# Patient Record
Sex: Female | Born: 1955 | Hispanic: Refuse to answer | Smoking: Former smoker
Health system: Southern US, Community
[De-identification: ages and names within clinical notes are randomized; demographics above are authoritative.]

## PROBLEM LIST (undated history)

## (undated) DIAGNOSIS — E28319 Asymptomatic premature menopause: Secondary | ICD-10-CM

## (undated) DIAGNOSIS — M810 Age-related osteoporosis without current pathological fracture: Secondary | ICD-10-CM

## (undated) DIAGNOSIS — E039 Hypothyroidism, unspecified: Secondary | ICD-10-CM

## (undated) DIAGNOSIS — I341 Nonrheumatic mitral (valve) prolapse: Secondary | ICD-10-CM

## (undated) HISTORY — DX: Age-related osteoporosis without current pathological fracture: M81.0

## (undated) HISTORY — DX: Nonrheumatic mitral (valve) prolapse: I34.1

## (undated) HISTORY — PX: DILATION AND CURETTAGE OF UTERUS: SHX78

## (undated) HISTORY — PX: OTHER SURGICAL HISTORY: SHX169

## (undated) HISTORY — DX: Asymptomatic premature menopause: E28.319

## (undated) HISTORY — DX: Hypothyroidism, unspecified: E03.9

---

## 1982-06-09 HISTORY — PX: CERVICAL CONE BIOPSY: SUR198

## 1998-11-01 ENCOUNTER — Other Ambulatory Visit: Admission: RE | Admit: 1998-11-01 | Discharge: 1998-11-01 | Payer: Self-pay | Admitting: Obstetrics and Gynecology

## 1999-10-17 ENCOUNTER — Other Ambulatory Visit: Admission: RE | Admit: 1999-10-17 | Discharge: 1999-10-17 | Payer: Self-pay | Admitting: Obstetrics and Gynecology

## 2000-11-05 ENCOUNTER — Other Ambulatory Visit: Admission: RE | Admit: 2000-11-05 | Discharge: 2000-11-05 | Payer: Self-pay | Admitting: Obstetrics and Gynecology

## 2001-11-05 ENCOUNTER — Other Ambulatory Visit: Admission: RE | Admit: 2001-11-05 | Discharge: 2001-11-05 | Payer: Self-pay | Admitting: Obstetrics and Gynecology

## 2003-01-25 ENCOUNTER — Other Ambulatory Visit: Admission: RE | Admit: 2003-01-25 | Discharge: 2003-01-25 | Payer: Self-pay | Admitting: Obstetrics and Gynecology

## 2004-01-24 ENCOUNTER — Other Ambulatory Visit: Admission: RE | Admit: 2004-01-24 | Discharge: 2004-01-24 | Payer: Self-pay | Admitting: Obstetrics and Gynecology

## 2005-02-05 ENCOUNTER — Other Ambulatory Visit: Admission: RE | Admit: 2005-02-05 | Discharge: 2005-02-05 | Payer: Self-pay | Admitting: Addiction Medicine

## 2006-02-11 ENCOUNTER — Other Ambulatory Visit: Admission: RE | Admit: 2006-02-11 | Discharge: 2006-02-11 | Payer: Self-pay | Admitting: Obstetrics and Gynecology

## 2006-04-09 ENCOUNTER — Ambulatory Visit (HOSPITAL_COMMUNITY): Admission: RE | Admit: 2006-04-09 | Discharge: 2006-04-09 | Payer: Self-pay | Admitting: Family Medicine

## 2006-05-28 ENCOUNTER — Ambulatory Visit (HOSPITAL_COMMUNITY): Admission: RE | Admit: 2006-05-28 | Discharge: 2006-05-28 | Payer: Self-pay | Admitting: Interventional Cardiology

## 2006-06-04 ENCOUNTER — Ambulatory Visit (HOSPITAL_COMMUNITY): Admission: RE | Admit: 2006-06-04 | Discharge: 2006-06-04 | Payer: Self-pay | Admitting: Gastroenterology

## 2006-08-13 ENCOUNTER — Ambulatory Visit (HOSPITAL_COMMUNITY): Admission: RE | Admit: 2006-08-13 | Discharge: 2006-08-13 | Payer: Self-pay | Admitting: Interventional Cardiology

## 2006-08-13 ENCOUNTER — Ambulatory Visit: Payer: Self-pay | Admitting: Cardiovascular Disease

## 2007-02-17 ENCOUNTER — Other Ambulatory Visit: Admission: RE | Admit: 2007-02-17 | Discharge: 2007-02-17 | Payer: Self-pay | Admitting: Obstetrics and Gynecology

## 2007-05-03 ENCOUNTER — Encounter: Payer: Self-pay | Admitting: Infectious Diseases

## 2007-06-22 ENCOUNTER — Ambulatory Visit: Payer: Self-pay | Admitting: Infectious Diseases

## 2007-06-22 DIAGNOSIS — L0292 Furuncle, unspecified: Secondary | ICD-10-CM | POA: Insufficient documentation

## 2007-06-22 DIAGNOSIS — F329 Major depressive disorder, single episode, unspecified: Secondary | ICD-10-CM

## 2007-06-22 DIAGNOSIS — L0293 Carbuncle, unspecified: Secondary | ICD-10-CM

## 2007-06-22 DIAGNOSIS — N879 Dysplasia of cervix uteri, unspecified: Secondary | ICD-10-CM | POA: Insufficient documentation

## 2007-06-22 DIAGNOSIS — F3289 Other specified depressive episodes: Secondary | ICD-10-CM | POA: Insufficient documentation

## 2007-06-22 DIAGNOSIS — I080 Rheumatic disorders of both mitral and aortic valves: Secondary | ICD-10-CM

## 2007-06-22 DIAGNOSIS — N390 Urinary tract infection, site not specified: Secondary | ICD-10-CM | POA: Insufficient documentation

## 2008-02-23 ENCOUNTER — Encounter: Payer: Self-pay | Admitting: Women's Health

## 2008-02-23 ENCOUNTER — Other Ambulatory Visit: Admission: RE | Admit: 2008-02-23 | Discharge: 2008-02-23 | Payer: Self-pay | Admitting: Obstetrics and Gynecology

## 2008-02-23 ENCOUNTER — Ambulatory Visit: Payer: Self-pay | Admitting: Women's Health

## 2008-06-09 DIAGNOSIS — E039 Hypothyroidism, unspecified: Secondary | ICD-10-CM

## 2008-06-09 HISTORY — DX: Hypothyroidism, unspecified: E03.9

## 2009-02-07 DIAGNOSIS — M810 Age-related osteoporosis without current pathological fracture: Secondary | ICD-10-CM

## 2009-02-07 HISTORY — DX: Age-related osteoporosis without current pathological fracture: M81.0

## 2009-02-28 ENCOUNTER — Ambulatory Visit: Payer: Self-pay | Admitting: Women's Health

## 2009-02-28 ENCOUNTER — Encounter: Payer: Self-pay | Admitting: Women's Health

## 2009-02-28 ENCOUNTER — Other Ambulatory Visit: Admission: RE | Admit: 2009-02-28 | Discharge: 2009-02-28 | Payer: Self-pay | Admitting: Obstetrics and Gynecology

## 2009-03-12 ENCOUNTER — Ambulatory Visit: Payer: Self-pay | Admitting: Women's Health

## 2009-05-15 ENCOUNTER — Ambulatory Visit: Payer: Self-pay | Admitting: Women's Health

## 2010-03-14 ENCOUNTER — Ambulatory Visit: Payer: Self-pay | Admitting: Women's Health

## 2010-03-14 ENCOUNTER — Other Ambulatory Visit: Admission: RE | Admit: 2010-03-14 | Discharge: 2010-03-14 | Payer: Self-pay | Admitting: Obstetrics and Gynecology

## 2010-04-10 ENCOUNTER — Ambulatory Visit: Payer: Self-pay | Admitting: Women's Health

## 2010-06-30 ENCOUNTER — Encounter: Payer: Self-pay | Admitting: Interventional Cardiology

## 2011-01-28 ENCOUNTER — Encounter: Payer: Self-pay | Admitting: *Deleted

## 2011-03-25 ENCOUNTER — Telehealth: Payer: Self-pay | Admitting: *Deleted

## 2011-03-25 NOTE — Telephone Encounter (Signed)
After months of attempts to help get benefit coverage for Prolia. It has come to this that patient will have to pay out of pocket for Prolia before getitng reimbursed by the Prolia plus card. The patient cannot afford to pay out of pocket. Pt has previuosly been on Fosomax, Boniva and Actonel. Would going back on one of those or Atelvia be benefical to her? She states she doesn't want to go back on Fosomax. Pls advise.

## 2011-03-26 NOTE — Telephone Encounter (Signed)
[name], please call patient and informed Grace Wood is best absorbed. It isn't generic, we could start with samples to see how well she does with that.

## 2011-03-27 NOTE — Telephone Encounter (Signed)
I called patient yesterday and left her a detailed message on her work voicemail that we are trying to get her samples of Atelvia. I will call her back when I hear back from the pharm rep. Kw

## 2011-04-04 NOTE — Telephone Encounter (Signed)
Samples received and mailed to patient. She will call me and let us know if she likes it and wil then call in a RX.

## 2011-04-15 ENCOUNTER — Other Ambulatory Visit: Payer: Self-pay | Admitting: *Deleted

## 2011-04-15 DIAGNOSIS — M810 Age-related osteoporosis without current pathological fracture: Secondary | ICD-10-CM

## 2011-04-23 ENCOUNTER — Encounter: Payer: Self-pay | Admitting: Women's Health

## 2011-04-28 ENCOUNTER — Encounter: Payer: Self-pay | Admitting: *Deleted

## 2011-04-29 ENCOUNTER — Ambulatory Visit (INDEPENDENT_AMBULATORY_CARE_PROVIDER_SITE_OTHER): Payer: 59 | Admitting: Women's Health

## 2011-04-29 ENCOUNTER — Other Ambulatory Visit (HOSPITAL_COMMUNITY)
Admission: RE | Admit: 2011-04-29 | Discharge: 2011-04-29 | Disposition: A | Discharge status: Home / Self Care | Payer: 59 | Source: Ambulatory Visit | Attending: Women's Health | Admitting: Women's Health

## 2011-04-29 ENCOUNTER — Encounter: Payer: Self-pay | Admitting: Women's Health

## 2011-04-29 VITALS — BP 130/70 | Ht 61.5 in | Wt 125.0 lb

## 2011-04-29 DIAGNOSIS — M810 Age-related osteoporosis without current pathological fracture: Secondary | ICD-10-CM

## 2011-04-29 DIAGNOSIS — B009 Herpesviral infection, unspecified: Secondary | ICD-10-CM

## 2011-04-29 DIAGNOSIS — E039 Hypothyroidism, unspecified: Secondary | ICD-10-CM

## 2011-04-29 DIAGNOSIS — I341 Nonrheumatic mitral (valve) prolapse: Secondary | ICD-10-CM | POA: Insufficient documentation

## 2011-04-29 DIAGNOSIS — Z13220 Encounter for screening for lipoid disorders: Secondary | ICD-10-CM

## 2011-04-29 DIAGNOSIS — R823 Hemoglobinuria: Secondary | ICD-10-CM

## 2011-04-29 DIAGNOSIS — Z01419 Encounter for gynecological examination (general) (routine) without abnormal findings: Secondary | ICD-10-CM | POA: Insufficient documentation

## 2011-04-29 DIAGNOSIS — Z833 Family history of diabetes mellitus: Secondary | ICD-10-CM

## 2011-04-29 MED ORDER — RISEDRONATE SODIUM 35 MG PO TBEC: 35.0000 mg | DELAYED_RELEASE_TABLET | ORAL | Status: DC

## 2011-04-29 MED ORDER — VALACYCLOVIR HCL 500 MG PO TABS: 500.0000 mg | ORAL_TABLET | Freq: Every day | ORAL | Status: AC

## 2011-04-29 NOTE — Progress Notes (Signed)
Grace Wood 1955-07-22 [id number]    History:    The patient presents for annual exam.    Past medical history, past surgical history, family history and social history were all reviewed and documented in the EPIC chart.   ROS:  A  ROS was performed and pertinent positives and negatives are included in the history.  Exam:  Filed Vitals:   04/29/11 0917  BP: 130/70    General appearance:  Normal Head/Neck:  Normal, without cervical or supraclavicular adenopathy. Thyroid:  Symmetrical, normal in size, without palpable masses or nodularity. Respiratory  Effort:  Normal  Auscultation:  Clear without wheezing or rhonchi Cardiovascular  Auscultation:  Regular rate, without rubs, murmurs or gallops  Edema/varicosities:  Not grossly evident Abdominal  Soft,nontender, without masses, guarding or rebound.  Liver/spleen:  No organomegaly noted  Hernia:  None appreciated  Skin  Inspection:  Grossly normal  Palpation:  Grossly normal Neurologic/psychiatric  Orientation:  Normal with appropriate conversation.  Mood/affect:  Normal  Genitourinary    Breasts: Examined lying and sitting.     Right: Without masses, retractions, discharge or axillary adenopathy.     Left: Without masses, retractions, discharge or axillary adenopathy.   Inguinal/mons:  Normal without inguinal adenopathy  External genitalia:  Normal  BUS/Urethra/Skene's glands:  Normal  Bladder:  Normal  Vagina:  Normal  Cervix:  Normal  Uterus:   normal in size, shape and contour.  Midline and mobile  Adnexa/parametria:     Rt: Without masses or tenderness.   Lt: Without masses or tenderness.  Anus and perineum: Normal  Digital rectal exam: Normal sphincter tone without palpated masses or tenderness  Assessment/Plan:  55 y.o. DW F. G0  for annual exam no complaints. Postmenopausal on no HRT and no bleeding. History of  menopause at age 55. History of osteoporosis, had 1 dose of Prolia  December 2011.( No insurance  coverage, unable to afford). History of failed Actonel treatment. On Atelvia weekly now. History of an abnormal Pap in 84, normal Paps since. History of normal mammograms. Normal colonoscopy in 08.  Normal postmenopausal exam  Osteoporosis Hypothyroidism History of hematuria with a negative workup/Dr. Patsi Sears  Plan: DEXA reviewed. T score -2.2 at hip, statistically significant increase of 5.8% at hip. Reviewed importance of exercise, calcium rich diet, continue Atelvia 35mg  weekly. Home safety and fall prevention reviewed. Prescription, proper use, sample given. SBEs, continue annual screening. Synthroid 50 mcg  daily prescription, proper use was given and reviewed. Reviewed importance of condoms if becomes sexually active. History of HSV with rare outbreaks, Valtrex 500 twice a day when necessary. CBC, glucose, TSH, lipid profile, vitamin D,  UA and Pap  Harrington Challenger WHNP, 2:18 PM 04/29/2011

## 2011-04-29 NOTE — Patient Instructions (Signed)
Vit D 2000 daily  Continue exercise

## 2011-04-30 LAB — VITAMIN D 25 HYDROXY (VIT D DEFICIENCY, FRACTURES): Vit D, 25-Hydroxy: 32 ng/mL (ref 30–89)

## 2011-05-16 ENCOUNTER — Ambulatory Visit (INDEPENDENT_AMBULATORY_CARE_PROVIDER_SITE_OTHER): Payer: 59

## 2011-05-16 DIAGNOSIS — J069 Acute upper respiratory infection, unspecified: Secondary | ICD-10-CM

## 2011-08-06 ENCOUNTER — Telehealth: Payer: Self-pay | Admitting: *Deleted

## 2011-08-06 NOTE — Telephone Encounter (Signed)
Pt called requesting rx for her atelvia. Pt was told to call if rx needed. Please advise

## 2011-08-06 NOTE — Telephone Encounter (Signed)
Left message on pt work vm that samples with be left at front desk for pick up.

## 2011-08-06 NOTE — Telephone Encounter (Signed)
Please call patient, inform we have samples that she could pickup please put out 8 (12 if we have) samples which will be enough for 2 months. Thanks

## 2011-08-18 ENCOUNTER — Other Ambulatory Visit: Payer: Self-pay | Admitting: *Deleted

## 2011-08-18 MED ORDER — LEVOTHYROXINE SODIUM 50 MCG PO TABS: 50.0000 ug | ORAL_TABLET | Freq: Every day | ORAL | Status: DC

## 2011-08-18 NOTE — Telephone Encounter (Signed)
Lm for patient to call

## 2011-08-18 NOTE — Telephone Encounter (Signed)
Please call in Synthroid 50 #30 with no refills. Instruct patient to come to office for TSH. Her last TSH was 3.86 hi end of normal. Instruct to make a lab office visit, and pick up Atelvia samples when she is here.

## 2011-08-19 ENCOUNTER — Other Ambulatory Visit: Payer: Self-pay | Admitting: *Deleted

## 2011-08-19 DIAGNOSIS — E039 Hypothyroidism, unspecified: Secondary | ICD-10-CM

## 2011-08-19 NOTE — Telephone Encounter (Signed)
Patient informed order in pc and appt made to recheck

## 2011-09-02 ENCOUNTER — Ambulatory Visit (INDEPENDENT_AMBULATORY_CARE_PROVIDER_SITE_OTHER): Payer: 59 | Admitting: Physician Assistant

## 2011-09-02 ENCOUNTER — Other Ambulatory Visit: Payer: 59

## 2011-09-02 VITALS — BP 147/75 | HR 69 | Temp 97.6°F | Resp 16 | Ht 61.5 in | Wt 126.6 lb

## 2011-09-02 DIAGNOSIS — L03818 Cellulitis of other sites: Secondary | ICD-10-CM

## 2011-09-02 DIAGNOSIS — H6010 Cellulitis of external ear, unspecified ear: Secondary | ICD-10-CM

## 2011-09-02 DIAGNOSIS — E039 Hypothyroidism, unspecified: Secondary | ICD-10-CM

## 2011-09-02 LAB — TSH: TSH: 2.834 u[IU]/mL (ref 0.350–4.500)

## 2011-09-02 MED ORDER — CEPHALEXIN 500 MG PO CAPS: 500.0000 mg | ORAL_CAPSULE | Freq: Three times a day (TID) | ORAL | Status: AC

## 2011-09-02 NOTE — Progress Notes (Signed)
Patient ID: Grace Wood MRN: [id number], DOB: 12-15-1955, 56 y.o. Date of Encounter: 09/02/2011, 11:42 AM  Primary Physician: Lolita Patella, MD, MD  Chief Complaint: Bump in left ear  HPI: 56 y.o. year old female with history below presents with 2 day history of a bump in her left ear. History of multiple "staph infections." Previous cultures have all been MSSA. Never with MRSA. Noticed a small tender bump along the opening of her left ear canal 2 days prior. Afebrile. No chills. No erythema, or discharge. Normal hearing. No swelling of the ear itself. Has previously failed Doxycycline for separate infections over the years and requests different antibiotic.    Past Medical History  Diagnosis Date  . Premature menopause     AGE 1  . Dysplasia of cervix 1984  . 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 Hypothyroid 2010  . Hematuria     NEGATIVE WORK UP DR Marcello Fennel  . Mitral valve prolapse      Home Meds: Prior to Admission medications   Medication Sig Start Date End Date Taking? Authorizing Provider  levothyroxine (SYNTHROID, LEVOTHROID) 50 MCG tablet Take 1 tablet (50 mcg total) by mouth daily. 08/18/11  Yes Harrington Challenger, NP  cephALEXin (KEFLEX) 500 MG capsule Take 1 capsule (500 mg total) by mouth 3 (three) times daily. 09/02/11 09/12/11  Jonluke Cobbins M Sahasra Belue, PA-C  Risedronate Sodium 35 MG TBEC Take 1 tablet (35 mg total) by mouth once a week. 04/29/11   Harrington Challenger, NP    Allergies:  Allergies  Allergen Reactions  . Hydrocodone-Acetaminophen   . Propoxyphene N-Acetaminophen   . Septra (Bactrim)   . Sulfonamide Derivatives     History   Social History  . Marital Status: Legally Separated    Spouse Name: N/A    Number of Children: N/A  . Years of Education: N/A   Occupational History  . Not on file.   Social History Main Topics  . Smoking status: Never Smoker   . Smokeless tobacco: Never Used  . Alcohol Use: No  . Drug Use: No  . Sexually Active: No   Other  Topics Concern  . Not on file   Social History Narrative  . No narrative on file     Review of Systems: Constitutional: negative for chills, fever, night sweats, weight changes, or fatigue  HEENT: negative for vision changes, hearing loss, congestion, rhinorrhea, ST, epistaxis, or sinus pressure Cardiovascular: negative for chest pain or palpitations Respiratory: negative for hemoptysis, wheezing, shortness of breath, or cough Abdominal: negative for abdominal pain, nausea, vomiting, diarrhea, or constipation Dermatological: negative for rash Neurologic: negative for headache, dizziness, or syncope All other systems reviewed and are otherwise negative with the exception to those above and in the HPI.   Physical Exam: Blood pressure 147/75, pulse 69, temperature 97.6 F (36.4 C), resp. rate 16, height 5' 1.5" (1.562 m), weight 126 lb 9.6 oz (57.425 kg), last menstrual period 04/28/1997., Body mass index is 23.53 kg/(m^2). General: Well developed, well nourished, in no acute distress. Head: Normocephalic, atraumatic, eyes without discharge, sclera non-icteric, nares are without discharge. Left auditory canal with pinpoint pustule along the opening. No erythema or discharge. TTP over the tragus. No TTP of the mastoid. Right canal clear. Bilateral TM's are without perforation, pearly grey and translucent with reflective cone of light bilaterally. Oral cavity moist, posterior pharynx without exudate, erythema, peritonsillar abscess, or post nasal drip.  Neck: Supple. No thyromegaly. Full ROM. No lymphadenopathy.  Lungs: Clear bilaterally to auscultation without wheezes, rales, or rhonchi. Breathing is unlabored. Heart: RRR with S1 S2. No murmurs, rubs, or gallops appreciated. Msk:  Strength and tone normal for age. Extremities/Skin: Warm and dry. No clubbing or cyanosis. No edema. No rashes or suspicious lesions. Neuro: Alert and oriented X 3. Moves all extremities spontaneously. Gait is  normal. CNII-XII grossly in tact. Psych:  Responds to questions appropriately with a normal affect.     ASSESSMENT AND PLAN:  56 y.o. year old female with mild cellulitis of the left auditory canal opening. -Keflex 500 mg #30 1 po tid RF 1 -Warm compresses -RTC if persists or worsens  Signed, Eula Listen, PA-C 09/02/2011 11:42 AM

## 2011-10-23 ENCOUNTER — Other Ambulatory Visit: Payer: Self-pay

## 2011-10-23 MED ORDER — LEVOTHYROXINE SODIUM 50 MCG PO TABS: 50.0000 ug | ORAL_TABLET | Freq: Every day | ORAL | Status: DC

## 2011-11-07 ENCOUNTER — Ambulatory Visit (INDEPENDENT_AMBULATORY_CARE_PROVIDER_SITE_OTHER): Payer: 59 | Admitting: Physician Assistant

## 2011-11-07 VITALS — BP 129/88 | HR 86 | Temp 99.0°F | Resp 16 | Ht 62.0 in | Wt 123.0 lb

## 2011-11-07 DIAGNOSIS — R05 Cough: Secondary | ICD-10-CM

## 2011-11-07 DIAGNOSIS — J019 Acute sinusitis, unspecified: Secondary | ICD-10-CM

## 2011-11-07 MED ORDER — IPRATROPIUM BROMIDE 0.06 % NA SOLN: 2.0000 | Freq: Three times a day (TID) | NASAL | Status: AC

## 2011-11-07 MED ORDER — CEFDINIR 300 MG PO CAPS: 300.0000 mg | ORAL_CAPSULE | Freq: Two times a day (BID) | ORAL | Status: AC

## 2011-11-07 MED ORDER — BENZONATATE 100 MG PO CAPS: 100.0000 mg | ORAL_CAPSULE | Freq: Three times a day (TID) | ORAL | Status: AC | PRN

## 2011-11-07 NOTE — Progress Notes (Signed)
Patient ID: Grace Wood MRN: [id number], DOB: [birth year], 56 y.o. Date of Encounter: 11/07/2011, 6:23 PM  Primary Physician: Lolita Patella, MD, MD  Chief Complaint:  Chief Complaint  Patient presents with  . Cough    2 days  . Fever    2 days    HPI: 56 y.o. year old female presents with a two day history of nasal congestion, post nasal drip, sore throat, sinus pressure, and cough. Subjective fever and chills. Nasal congestion thick and green/yellow. Sinus pressure is the worst symptom. Cough is productive secondary to post nasal drip and not associated with time of day. Ears feel full, leading to sensation of muffled hearing. Has tried OTC cold preps without success. No GI complaints. Appetite normal.  No recent antibiotics, recent travels, or sick contacts   No leg trauma, sedentary periods, h/o cancer, or tobacco use.  Past Medical History  Diagnosis Date  . Premature menopause     AGE 56  . Dysplasia of cervix 1984  . 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 Hypothyroid 2010  . Hematuria     NEGATIVE WORK UP DR Marcello Fennel  . Mitral valve prolapse      Home Meds: Prior to Admission medications   Medication Sig Start Date End Date Taking? Authorizing Provider  fexofenadine (ALLEGRA) 180 MG tablet Take 180 mg by mouth daily.   Yes Historical Provider, MD  levothyroxine (SYNTHROID, LEVOTHROID) 50 MCG tablet Take 1 tablet (50 mcg total) by mouth daily. 10/23/11  Yes Harrington Challenger, NP                       Risedronate Sodium 35 MG TBEC Take 1 tablet (35 mg total) by mouth once a week. 04/29/11   Harrington Challenger, NP    Allergies:  Allergies  Allergen Reactions  . Hydrocodone-Acetaminophen   . Propoxyphene-Acetaminophen   . Septra (Bactrim)   . Sulfonamide Derivatives     History   Social History  . Marital Status: Legally Separated    Spouse Name: N/A    Number of Children: N/A  . Years of Education: N/A   Occupational History  . Not on file.    Social History Main Topics  . Smoking status: Never Smoker   . Smokeless tobacco: Never Used  . Alcohol Use: No  . Drug Use: No  . Sexually Active: No   Other Topics Concern  . Not on file   Social History Narrative  . No narrative on file     Review of Systems: Constitutional: negative for chills, fever, night sweats or weight changes Cardiovascular: negative for chest pain or palpitations Respiratory: negative for hemoptysis, wheezing, or shortness of breath Abdominal: negative for abdominal pain, nausea, vomiting or diarrhea Dermatological: negative for rash Neurologic: negative for headache   Physical Exam: Blood pressure 129/88, pulse 86, temperature 99 F (37.2 C), temperature source Oral, resp. rate 16, height 5\' 2"  (1.575 m), weight 123 lb (55.792 kg), last menstrual period 04/28/1997, SpO2 97.00%., Body mass index is 22.50 kg/(m^2). General: Well developed, well nourished, in no acute distress. Head: Normocephalic, atraumatic, eyes without discharge, sclera non-icteric, nares are congested. Bilateral auditory canals clear, TM's are without perforation, pearly grey with reflective cone of light bilaterally. Serous effusion bilaterally behind TM's. Maxillary sinus TTP. Oral cavity moist, dentition normal. Posterior pharynx with post nasal drip and mild erythema. No peritonsillar abscess or tonsillar exudate. Neck: Supple. No thyromegaly. Full ROM. No  lymphadenopathy. Lungs: Clear bilaterally to auscultation without wheezes, rales, or rhonchi. Breathing is unlabored.  Heart: RRR with S1 S2. No murmurs, rubs, or gallops appreciated. Msk:  Strength and tone normal for age. Extremities: No clubbing or cyanosis. No edema. Neuro: Alert and oriented X 3. Moves all extremities spontaneously. CNII-XII grossly in tact. Psych:  Responds to questions appropriately with a normal affect.     ASSESSMENT AND PLAN:  56 y.o.  year old female with sinusitis -Omnicef 300 mg 1 po bid #20  no RF  -Tessalon Perles 100 mg 1 po tid prn cough #30 no RF  -Atrovent NS 0.06% 2 sprays each nare bid prn #1 no RF -Mucinex -Tylenol/Motrin prn -Rest/fluids -RTC precautions -RTC 3-5 days if no improvement  Elinor Dodge, PA-C 11/07/2011 6:23 PM

## 2012-02-23 ENCOUNTER — Telehealth: Payer: Self-pay | Admitting: *Deleted

## 2012-02-23 NOTE — Telephone Encounter (Signed)
Pt calling requesting more samples of atelvia 35 mg, samples left up front for pick up.

## 2012-04-23 ENCOUNTER — Encounter: Payer: Self-pay | Admitting: Women's Health

## 2012-04-30 ENCOUNTER — Encounter: Payer: Self-pay | Admitting: Women's Health

## 2012-04-30 ENCOUNTER — Ambulatory Visit (INDEPENDENT_AMBULATORY_CARE_PROVIDER_SITE_OTHER): Payer: 59 | Admitting: Women's Health

## 2012-04-30 VITALS — BP 116/88 | Ht 61.25 in | Wt 119.0 lb

## 2012-04-30 DIAGNOSIS — M8580 Other specified disorders of bone density and structure, unspecified site: Secondary | ICD-10-CM

## 2012-04-30 DIAGNOSIS — Z01419 Encounter for gynecological examination (general) (routine) without abnormal findings: Secondary | ICD-10-CM

## 2012-04-30 DIAGNOSIS — M949 Disorder of cartilage, unspecified: Secondary | ICD-10-CM

## 2012-04-30 DIAGNOSIS — Z833 Family history of diabetes mellitus: Secondary | ICD-10-CM

## 2012-04-30 DIAGNOSIS — M810 Age-related osteoporosis without current pathological fracture: Secondary | ICD-10-CM

## 2012-04-30 DIAGNOSIS — B009 Herpesviral infection, unspecified: Secondary | ICD-10-CM

## 2012-04-30 DIAGNOSIS — M899 Disorder of bone, unspecified: Secondary | ICD-10-CM

## 2012-04-30 DIAGNOSIS — E039 Hypothyroidism, unspecified: Secondary | ICD-10-CM

## 2012-04-30 DIAGNOSIS — Z13220 Encounter for screening for lipoid disorders: Secondary | ICD-10-CM

## 2012-04-30 LAB — CBC WITH DIFFERENTIAL/PLATELET
Basophils Relative: 0 % (ref 0–1)
Hemoglobin: 12.1 g/dL (ref 12.0–15.0)
Lymphs Abs: 1.5 10*3/uL (ref 0.7–4.0)
Monocytes Relative: 10 % (ref 3–12)
Neutro Abs: 4 10*3/uL (ref 1.7–7.7)
Neutrophils Relative %: 65 % (ref 43–77)
RBC: 4.01 MIL/uL (ref 3.87–5.11)

## 2012-04-30 LAB — LIPID PANEL
Cholesterol: 181 mg/dL (ref 0–200)
HDL: 66 mg/dL (ref >39)
Total CHOL/HDL Ratio: 2.7 Ratio

## 2012-04-30 LAB — GLUCOSE, RANDOM: Glucose, Bld: 95 mg/dL (ref 70–99)

## 2012-04-30 MED ORDER — RISEDRONATE SODIUM 35 MG PO TBEC: DELAYED_RELEASE_TABLET | ORAL | Status: DC

## 2012-04-30 MED ORDER — LEVOTHYROXINE SODIUM 50 MCG PO TABS: 50.0000 ug | ORAL_TABLET | Freq: Every day | ORAL | Status: DC

## 2012-04-30 MED ORDER — VALACYCLOVIR HCL 500 MG PO TABS: 500.0000 mg | ORAL_TABLET | Freq: Two times a day (BID) | ORAL | Status: DC

## 2012-04-30 NOTE — Progress Notes (Signed)
Grace Wood June 21, 1955 [id number]    History:    The patient presents for annual exam.  Premature menopause/56  no bleeding on no HRT. History of an abnormal Pap with cone bx- 1984 with normal Paps after. History of normal mammograms. Hematuria with negative workup  Dr. Patsi Sears, hyperextension of bladder 1985. Osteoporosis T score -2.6 of left femoral neck 02/2009. Had one dose of Prolia in 2011, insurance did not cover, has been on Atelvia weekly. Had no BMD improvement on Boniva or Actonel 150 monthly. DEXA 2012 T score of left femoral neck -1.9, significant increase in BMD. Hypothyroid on Synthroid. Negative colonoscopy in 2008.   Past medical history, past surgical history, family history and social history were all reviewed and documented in the EPIC chart.   ROS:  A  ROS was performed and pertinent positives and negatives are included in the history.  Exam:  Filed Vitals:   04/30/12 0837  BP: 116/88    General appearance:  Moderate scoliosis  Head/Neck:  Normal, without cervical or supraclavicular adenopathy. Thyroid:  Symmetrical, normal in size, without palpable masses or nodularity. Respiratory  Effort:  Normal  Auscultation:  Clear without wheezing or rhonchi Cardiovascular  Auscultation:  Regular rate, without rubs, murmurs or gallops  Edema/varicosities:  Not grossly evident Abdominal  Soft,nontender, without masses, guarding or rebound.  Liver/spleen:  No organomegaly noted  Hernia:  None appreciated  Skin  Inspection:  Grossly normal  Palpation:  Grossly normal Neurologic/psychiatric  Orientation:  Normal with appropriate conversation.  Mood/affect:  Normal  Genitourinary    Breasts: Examined lying and sitting.     Right: Without masses, retractions, discharge or axillary adenopathy.     Left: Without masses, retractions, discharge or axillary adenopathy.   Inguinal/mons:  Normal without inguinal adenopathy  External genitalia:   Normal  BUS/Urethra/Skene's glands:  Normal  Bladder:  Normal  Vagina:  Normal  Cervix:  Normal  Uterus:   normal in size, shape and contour.  Midline and mobile  Adnexa/parametria:     Rt: Without masses or tenderness.   Lt: Without masses or tenderness.  Anus and perineum: Normal  Digital rectal exam: Normal sphincter tone without palpated masses or tenderness  Assessment/Plan:  56 y.o. DW F. G0 for annual exam with no complaints  Osteoporosis on Atelvia weekly Hypothyroid Benign hematuria history HSV-rare outbreaks  Plan: Continue Atelvia 35 weekly, prescription, proper use, coupon given. Home safety and fall prevention discussed, repeat DEXA 2014. SBE's, continue annual mammogram, calcium rich diet, vitamin D 2000 daily encouraged. Synthroid 50 mcg daily, prescription, proper use given and reviewed. CBC, glucose, lipid panel, TSH, UA, home Hemoccult card given with instructions, Pap normal 2012, reviewed new screening guidelines. Reviewed importance of condoms if become sexually active. Valtrex 500 twice daily 3-5 days as needed.      Harrington Challenger [facility], 12:39 PM 04/30/2012

## 2012-04-30 NOTE — Patient Instructions (Addendum)

## 2012-05-01 LAB — URINALYSIS W MICROSCOPIC + REFLEX CULTURE
Bilirubin Urine: NEGATIVE
Glucose, UA: NEGATIVE mg/dL
Protein, ur: NEGATIVE mg/dL
Squamous Epithelial / LPF: NONE SEEN
pH: 6.5 (ref 5.0–8.0)

## 2012-05-02 LAB — URINE CULTURE: Colony Count: >=100000

## 2012-05-03 ENCOUNTER — Other Ambulatory Visit: Payer: Self-pay | Admitting: Obstetrics and Gynecology

## 2012-05-03 MED ORDER — LEVOTHYROXINE SODIUM 75 MCG PO TABS: 75.0000 ug | ORAL_TABLET | Freq: Every day | ORAL | Status: DC

## 2012-05-04 ENCOUNTER — Encounter: Payer: Self-pay | Admitting: *Deleted

## 2012-05-04 NOTE — Progress Notes (Signed)
Patient ID: Grace Wood, female   DOB: 1955/08/29, 56 y.o.   MRN: [id number] Pt medication for atelvia 35 mg approved thru 05/03/13. Pharmacy informed as well.

## 2012-05-11 ENCOUNTER — Encounter: Payer: Self-pay | Admitting: Women's Health

## 2012-06-14 ENCOUNTER — Telehealth: Payer: Self-pay | Admitting: Women's Health

## 2012-06-14 NOTE — Telephone Encounter (Signed)
Left message to call.

## 2012-06-14 NOTE — Telephone Encounter (Signed)
Please call and inform her of your findings. (thanks) If that helps good and appears it will not cause harm. I've never heard of it. Most important factor for intimacy is the relationship for women.

## 2012-06-14 NOTE — Telephone Encounter (Signed)
Patient called. Said she was "researching "intensifying intimacy" on the internet" this weekend. She said she found something called Eros and wondered if you would prescribe it for her.  When I "googled" it it internet says it is a non-prescription herbal mixture that supposed increases blood flow to the genitals.     I can call her and let her know it is not prescription unless I am incorrect and there is actually a Rx with this name?  Please advise what you would like me to to tell her in regards to this.

## 2012-06-15 NOTE — Telephone Encounter (Signed)
Left another message to call.

## 2012-06-16 NOTE — Telephone Encounter (Signed)
Thanks for talking with Grizel great advice.

## 2012-06-16 NOTE — Telephone Encounter (Signed)
Patient said she did not want to take the Eros if we were not familiar with it and could tell her it was okay. She will not take.  She said she has met someone special and many years since sex previously.  She said that all was okay but there was a little bit of discomfort and achieving orgasm difficult.  She asked about KY Jelly and was that okay for lubrication and was it going to cause infection.  I told her Robby Sermon was fine for lubrication and that any increase in moisture of any kind in the vagina could promote yeast growth but not to expect it to cause infection and to use it if needed.  I reminded her that the most important factor for intimacy is the relationship for women and we discussed that.  I wished her lmuch happinesswith her new relationship.

## 2012-07-13 ENCOUNTER — Other Ambulatory Visit: Payer: Self-pay | Admitting: *Deleted

## 2012-07-13 DIAGNOSIS — E039 Hypothyroidism, unspecified: Secondary | ICD-10-CM

## 2012-07-20 ENCOUNTER — Other Ambulatory Visit: Payer: 59

## 2012-07-20 DIAGNOSIS — E039 Hypothyroidism, unspecified: Secondary | ICD-10-CM

## 2012-07-20 LAB — TSH: TSH: 0.028 u[IU]/mL — ABNORMAL LOW (ref 0.350–4.500)

## 2012-07-21 ENCOUNTER — Other Ambulatory Visit: Payer: Self-pay | Admitting: Women's Health

## 2012-07-21 DIAGNOSIS — E039 Hypothyroidism, unspecified: Secondary | ICD-10-CM

## 2012-08-24 ENCOUNTER — Other Ambulatory Visit: Payer: Self-pay | Admitting: *Deleted

## 2012-08-24 MED ORDER — LEVOTHYROXINE SODIUM 75 MCG PO TABS: 75.0000 ug | ORAL_TABLET | Freq: Every day | ORAL | Status: DC

## 2012-10-11 ENCOUNTER — Other Ambulatory Visit: Payer: Self-pay

## 2012-10-11 DIAGNOSIS — M8580 Other specified disorders of bone density and structure, unspecified site: Secondary | ICD-10-CM

## 2012-10-12 ENCOUNTER — Telehealth: Payer: Self-pay | Admitting: *Deleted

## 2012-10-12 NOTE — Telephone Encounter (Signed)
Ateliva 35 mg 1 po mouth weekly. Approved authorization # [id number], pharmacy informed as well as patient.

## 2013-01-03 ENCOUNTER — Other Ambulatory Visit: Payer: Self-pay

## 2013-01-10 ENCOUNTER — Telehealth: Payer: Self-pay | Admitting: *Deleted

## 2013-01-10 ENCOUNTER — Other Ambulatory Visit: Payer: Self-pay | Admitting: *Deleted

## 2013-01-10 NOTE — Telephone Encounter (Signed)
PHARMACY FAXED OVER REFILL FOR LEVOTHYROXINE SODIUM 50 MCG. I LEFT MESSAGE ON PT VOICEMAIL THAT PER NANCY RESULT NOTE ON 07/21/2012 SHE WILL NEED TO HAVE TSH LEVEL RECHECK. ORDER IN IN SYSTEM FOR THIS TO BE DONE.

## 2013-01-19 ENCOUNTER — Other Ambulatory Visit: Payer: 59

## 2013-01-26 ENCOUNTER — Other Ambulatory Visit: Payer: 59

## 2013-01-26 DIAGNOSIS — E039 Hypothyroidism, unspecified: Secondary | ICD-10-CM

## 2013-01-26 LAB — TSH: TSH: 5.67 u[IU]/mL — ABNORMAL HIGH (ref 0.350–4.500)

## 2013-01-27 ENCOUNTER — Other Ambulatory Visit: Payer: Self-pay | Admitting: Women's Health

## 2013-01-27 DIAGNOSIS — R7989 Other specified abnormal findings of blood chemistry: Secondary | ICD-10-CM

## 2013-01-27 MED ORDER — LEVOTHYROXINE SODIUM 75 MCG PO TABS: 75.0000 ug | ORAL_TABLET | Freq: Every day | ORAL | Status: DC

## 2013-01-28 ENCOUNTER — Telehealth: Payer: Self-pay

## 2013-01-28 NOTE — Telephone Encounter (Signed)
Telephone call, will take Synthroid 75 daily recheck TSH in 6 weeks. Is aware proper administration.

## 2013-01-28 NOTE — Telephone Encounter (Signed)
Notes Recorded by Harrington Challenger, NP on 01/27/2013 at 5:00 PM Message left ------  Notes Recorded by Keenan Bachelor on 01/27/2013 at 4:43 PM Patient informed. Order put in and Rx e-scribed.  Grace Wood, Patient said that she is actually taking the Synthroid 75 mcg M-Fr and the 50 mcg on the weekends. She said that was what you had told her to do. I told her I would make you aware of how she was taking it just in case it changed your recommendation below.     Patient returning this call. See below note for how to reach her.

## 2013-01-28 NOTE — Telephone Encounter (Signed)
Patient returned Grace Wood's call from yesterday.  She is at work [phone].  She said if she is on the phone when you call to tell whoever answers that she wanted them to get her to the phone.

## 2013-03-28 ENCOUNTER — Other Ambulatory Visit: Payer: Self-pay

## 2013-04-06 ENCOUNTER — Telehealth: Payer: Self-pay | Admitting: *Deleted

## 2013-04-06 NOTE — Telephone Encounter (Signed)
Pt called requesting refill on synthroid 75 mg, I left message on pt voicemail that it is time to have TSH level rechecked this is why rx was denied. I told her to call office and make lab appointment.

## 2013-04-15 ENCOUNTER — Other Ambulatory Visit: Payer: 59

## 2013-04-19 ENCOUNTER — Other Ambulatory Visit: Payer: 59

## 2013-04-19 DIAGNOSIS — R7989 Other specified abnormal findings of blood chemistry: Secondary | ICD-10-CM

## 2013-04-19 LAB — TSH: TSH: 0.044 u[IU]/mL — ABNORMAL LOW (ref 0.350–4.500)

## 2013-04-21 ENCOUNTER — Telehealth: Payer: Self-pay

## 2013-04-21 NOTE — Telephone Encounter (Signed)
error 

## 2013-04-22 ENCOUNTER — Other Ambulatory Visit: Payer: Self-pay | Admitting: *Deleted

## 2013-04-22 DIAGNOSIS — E039 Hypothyroidism, unspecified: Secondary | ICD-10-CM

## 2013-04-22 MED ORDER — LEVOTHYROXINE SODIUM 50 MCG PO TABS: 50.0000 ug | ORAL_TABLET | Freq: Every day | ORAL | Status: DC

## 2013-04-27 ENCOUNTER — Encounter: Payer: Self-pay | Admitting: Women's Health

## 2013-04-28 ENCOUNTER — Encounter: Payer: Self-pay | Admitting: Women's Health

## 2013-05-02 ENCOUNTER — Other Ambulatory Visit: Payer: Self-pay

## 2013-05-02 DIAGNOSIS — M8580 Other specified disorders of bone density and structure, unspecified site: Secondary | ICD-10-CM

## 2013-05-02 MED ORDER — RISEDRONATE SODIUM 35 MG PO TBEC: DELAYED_RELEASE_TABLET | ORAL | Status: DC

## 2013-05-04 ENCOUNTER — Encounter: Payer: 59 | Admitting: Women's Health

## 2013-05-10 ENCOUNTER — Ambulatory Visit (INDEPENDENT_AMBULATORY_CARE_PROVIDER_SITE_OTHER): Payer: 59 | Admitting: Women's Health

## 2013-05-10 ENCOUNTER — Encounter: Payer: Self-pay | Admitting: Women's Health

## 2013-05-10 ENCOUNTER — Other Ambulatory Visit (HOSPITAL_COMMUNITY)
Admission: RE | Admit: 2013-05-10 | Discharge: 2013-05-10 | Disposition: A | Discharge status: Home / Self Care | Payer: 59 | Source: Ambulatory Visit | Attending: Gynecology | Admitting: Gynecology

## 2013-05-10 VITALS — BP 122/78 | Ht 61.5 in | Wt 122.0 lb

## 2013-05-10 DIAGNOSIS — Z13220 Encounter for screening for lipoid disorders: Secondary | ICD-10-CM

## 2013-05-10 DIAGNOSIS — Z113 Encounter for screening for infections with a predominantly sexual mode of transmission: Secondary | ICD-10-CM

## 2013-05-10 DIAGNOSIS — M8580 Other specified disorders of bone density and structure, unspecified site: Secondary | ICD-10-CM

## 2013-05-10 DIAGNOSIS — B009 Herpesviral infection, unspecified: Secondary | ICD-10-CM

## 2013-05-10 DIAGNOSIS — Z01419 Encounter for gynecological examination (general) (routine) without abnormal findings: Secondary | ICD-10-CM

## 2013-05-10 DIAGNOSIS — M899 Disorder of bone, unspecified: Secondary | ICD-10-CM

## 2013-05-10 DIAGNOSIS — E039 Hypothyroidism, unspecified: Secondary | ICD-10-CM

## 2013-05-10 LAB — URINALYSIS W MICROSCOPIC + REFLEX CULTURE
Leukocytes, UA: NEGATIVE
Nitrite: NEGATIVE
Protein, ur: NEGATIVE mg/dL
Specific Gravity, Urine: 1.021 (ref 1.005–1.030)
pH: 5 (ref 5.0–8.0)

## 2013-05-10 LAB — CBC WITH DIFFERENTIAL/PLATELET
Basophils Absolute: 0 10*3/uL (ref 0.0–0.1)
Basophils Relative: 0 % (ref 0–1)
Eosinophils Absolute: 0.1 10*3/uL (ref 0.0–0.7)
Eosinophils Relative: 1 % (ref 0–5)
HCT: 35.3 % — ABNORMAL LOW (ref 36.0–46.0)
Lymphocytes Relative: 14 % (ref 12–46)
MCH: 29 pg (ref 26.0–34.0)
MCHC: 33.1 g/dL (ref 30.0–36.0)
MCV: 87.6 fL (ref 78.0–100.0)
Monocytes Absolute: 0.8 10*3/uL (ref 0.1–1.0)
RDW: 13 % (ref 11.5–15.5)
WBC: 8.8 10*3/uL (ref 4.0–10.5)

## 2013-05-10 LAB — LIPID PANEL
Cholesterol: 188 mg/dL (ref 0–200)
HDL: 76 mg/dL (ref >39)
LDL Cholesterol: 101 mg/dL — ABNORMAL HIGH (ref 0–99)
Total CHOL/HDL Ratio: 2.5 Ratio

## 2013-05-10 LAB — COMPREHENSIVE METABOLIC PANEL
ALT: 13 U/L (ref 0–35)
AST: 19 U/L (ref 0–37)
Albumin: 4.2 g/dL (ref 3.5–5.2)
Calcium: 9.3 mg/dL (ref 8.4–10.5)
Chloride: 105 mEq/L (ref 96–112)
Glucose, Bld: 87 mg/dL (ref 70–99)
Potassium: 3.7 mEq/L (ref 3.5–5.3)

## 2013-05-10 MED ORDER — LEVOTHYROXINE SODIUM 50 MCG PO TABS: 50.0000 ug | ORAL_TABLET | Freq: Every day | ORAL | Status: DC

## 2013-05-10 MED ORDER — VALACYCLOVIR HCL 500 MG PO TABS: 500.0000 mg | ORAL_TABLET | Freq: Two times a day (BID) | ORAL | Status: DC

## 2013-05-10 MED ORDER — RISEDRONATE SODIUM 35 MG PO TBEC: DELAYED_RELEASE_TABLET | ORAL | Status: DC

## 2013-05-10 NOTE — Addendum Note (Signed)
Addended by: Richardson Chiquito on: 05/10/2013 09:50 AM   Modules accepted: Orders

## 2013-05-10 NOTE — Patient Instructions (Signed)
Health Recommendations for Postmenopausal Women Respected and ongoing research has looked at the most common causes of death, disability, and poor quality of life in postmenopausal women. The causes include heart disease, diseases of blood vessels, diabetes, depression, cancer, and bone loss (osteoporosis). Many things can be done to help lower the chances of developing these and other common problems: CARDIOVASCULAR DISEASE Heart Disease: A heart attack is a medical emergency. Know the signs and symptoms of a heart attack. Below are things women can do to reduce their risk for heart disease.   Do not smoke. If you smoke, quit.  Aim for a healthy weight. Being overweight causes many preventable deaths. Eat a healthy and balanced diet and drink an adequate amount of liquids.  Get moving. Make a commitment to be more physically active. Aim for 30 minutes of activity on most, if not all days of the week.  Eat for heart health. Choose a diet that is low in saturated fat and cholesterol and eliminate trans fat. Include whole grains, vegetables, and fruits. Read and understand the labels on food containers before buying.  Know your numbers. Ask your caregiver to check your blood pressure, cholesterol (total, HDL, LDL, triglycerides) and blood glucose. Work with your caregiver on improving your entire clinical picture.  High blood pressure. Limit or stop your table salt intake (try salt substitute and food seasonings). Avoid salty foods and drinks. Read labels on food containers before buying. Eating well and exercising can help control high blood pressure. STROKE  Stroke is a medical emergency. Stroke may be the result of a blood clot in a blood vessel in the brain or by a brain hemorrhage (bleeding). Know the signs and symptoms of a stroke. To lower the risk of developing a stroke:  Avoid fatty foods.  Quit smoking.  Control your diabetes, blood pressure, and irregular heart rate. THROMBOPHLEBITIS  (BLOOD CLOT) OF THE LEG  Becoming overweight and leading a stationary lifestyle may also contribute to developing blood clots. Controlling your diet and exercising will help lower the risk of developing blood clots. CANCER SCREENING  Breast Cancer: Take steps to reduce your risk of breast cancer.  You should practice "breast self-awareness." This means understanding the normal appearance and feel of your breasts and should include breast self-examination. Any changes detected, no matter how small, should be reported to your caregiver.  After age 40, you should have a clinical breast exam (CBE) every year.  Starting at age 40, you should consider having a mammogram (breast X-ray) every year.  If you have a family history of breast cancer, talk to your caregiver about genetic screening.  If you are at high risk for breast cancer, talk to your caregiver about having an MRI and a mammogram every year.  Intestinal or Stomach Cancer: Tests to consider are a rectal exam, fecal occult blood, sigmoidoscopy, and colonoscopy. Women who are high risk may need to be screened at an earlier age and more often.  Cervical Cancer:  Beginning at age 30, you should have a Pap test every 3 years as long as the past 3 Pap tests have been normal.  If you have had past treatment for cervical cancer or a condition that could lead to cancer, you need Pap tests and screening for cancer for at least 20 years after your treatment.  If you had a hysterectomy for a problem that was not cancer or a condition that could lead to cancer, then you no longer need Pap tests.    If you are between ages 65 and 70, and you have had normal Pap tests going back 10 years, you no longer need Pap tests.  If Pap tests have been discontinued, risk factors (such as a new sexual partner) need to be reassessed to determine if screening should be resumed.  Some medical problems can increase the chance of getting cervical cancer. In these  cases, your caregiver may recommend more frequent screening and Pap tests.  Uterine Cancer: If you have vaginal bleeding after reaching menopause, you should notify your caregiver.  Ovarian cancer: Other than yearly pelvic exams, there are no reliable tests available to screen for ovarian cancer at this time except for yearly pelvic exams.  Lung Cancer: Yearly chest X-rays can detect lung cancer and should be done on high risk women, such as cigarette smokers and women with chronic lung disease (emphysema).  Skin Cancer: A complete body skin exam should be done at your yearly examination. Avoid overexposure to the sun and ultraviolet light lamps. Use a strong sun block cream when in the sun. All of these things are important in lowering the risk of skin cancer. MENOPAUSE Menopause Symptoms: Hormone therapy products are effective for treating symptoms associated with menopause:  Moderate to severe hot flashes.  Night sweats.  Mood swings.  Headaches.  Tiredness.  Loss of sex drive.  Insomnia.  Other symptoms. Hormone replacement carries certain risks, especially in older women. Women who use or are thinking about using estrogen or estrogen with progestin treatments should discuss that with their caregiver. Your caregiver will help you understand the benefits and risks. The ideal dose of hormone replacement therapy is not known. The Food and Drug Administration (FDA) has concluded that hormone therapy should be used only at the lowest doses and for the shortest amount of time to reach treatment goals.  OSTEOPOROSIS Protecting Against Bone Loss and Preventing Fracture: If you use hormone therapy for prevention of bone loss (osteoporosis), the risks for bone loss must outweigh the risk of the therapy. Ask your caregiver about other medications known to be safe and effective for preventing bone loss and fractures. To guard against bone loss or fractures, the following is recommended:  If  you are less than age 50, take 1000 mg of calcium and at least 600 mg of Vitamin D per day.  If you are greater than age 50 but less than age 70, take 1200 mg of calcium and at least 600 mg of Vitamin D per day.  If you are greater than age 70, take 1200 mg of calcium and at least 800 mg of Vitamin D per day. Smoking and excessive alcohol intake increases the risk of osteoporosis. Eat foods rich in calcium and vitamin D and do weight bearing exercises several times a week as your caregiver suggests. DIABETES Diabetes Melitus: If you have Type I or Type 2 diabetes, you should keep your blood sugar under control with diet, exercise and recommended medication. Avoid too many sweets, starchy and fatty foods. Being overweight can make control more difficult. COGNITION AND MEMORY Cognition and Memory: Menopausal hormone therapy is not recommended for the prevention of cognitive disorders such as Alzheimer's disease or memory loss.  DEPRESSION  Depression may occur at any age, but is common in elderly women. The reasons may be because of physical, medical, social (loneliness), or financial problems and needs. If you are experiencing depression because of medical problems and control of symptoms, talk to your caregiver about this. Physical activity and   exercise may help with mood and sleep. Community and volunteer involvement may help your sense of value and worth. If you have depression and you feel that the problem is getting worse or becoming severe, talk to your caregiver about treatment options that are best for you. ACCIDENTS  Accidents are common and can be serious in the elderly woman. Prepare your house to prevent accidents. Eliminate throw rugs, place hand bars in the bath, shower and toilet areas. Avoid wearing high heeled shoes or walking on wet, snowy, and icy areas. Limit or stop driving if you have vision or hearing problems, or you feel you are unsteady with you movements and  reflexes. HEPATITIS C Hepatitis C is a type of viral infection affecting the liver. It is spread mainly through contact with blood from an infected person. It can be treated, but if left untreated, it can lead to severe liver damage over years. Many people who are infected do not know that the virus is in their blood. If you are a "baby-boomer", it is recommended that you have one screening test for Hepatitis C. IMMUNIZATIONS  Several immunizations are important to consider having during your senior years, including:   Tetanus, diptheria, and pertussis booster shot.  Influenza every year before the flu season begins.  Pneumonia vaccine.  Shingles vaccine.  Others as indicated based on your specific needs. Talk to your caregiver about these. Document Released: 07/18/2005 Document Revised: 05/12/2012 Document Reviewed: 03/13/2008 ExitCare Patient Information 2014 [employer].  

## 2013-05-10 NOTE — Progress Notes (Addendum)
Grace Wood 1956/01/17 [id number]    History:    The patient presents for annual exam.  Postmenopausal with no bleeding and no HRT. Premature menopause age 57. Hypothyroid on Synthroid 50 mcg. Osteoporosis,  last DEXA 04/2013 T score -2.1 left femoral neck, stable on Atelvia. Had been on Boniva in the past with no improvement. One dose of Prolia 2011 stopped, not covered by insurance. HSV with rare outbreaks. Cone biopsy 1984 with normal Paps after. Normal mammograms. Negative colonoscopy 2008.  Past medical history, past surgical history, family history and social history were all reviewed and documented in the EPIC chart. History of hematuria with negative workup, hyperextension of bladder 85.   ROS:  A  ROS was performed and pertinent positives and negatives are included in the history.  Exam:  Filed Vitals:   05/10/13 0852  BP: 122/78    General appearance:  Mild  scoliosis Head/Neck:  Normal, without cervical or supraclavicular adenopathy. Thyroid:  Symmetrical, normal in size, without palpable masses or nodularity. Respiratory  Effort:  Normal  Auscultation:  Clear without wheezing or rhonchi Cardiovascular  Auscultation:  Regular rate, without rubs, murmurs or gallops  Edema/varicosities:  Not grossly evident Abdominal  Soft,nontender, without masses, guarding or rebound.  Liver/spleen:  No organomegaly noted  Hernia:  None appreciated  Skin  Inspection:  Grossly normal  Palpation:  Grossly normal Neurologic/psychiatric  Orientation:  Normal with appropriate conversation.  Mood/affect:  Normal  Genitourinary    Breasts: Examined lying and sitting.     Right: Without masses, retractions, discharge or axillary adenopathy.     Left: Without masses, retractions, discharge or axillary adenopathy.   Inguinal/mons:  Normal without inguinal adenopathy  External genitalia:  Normal  BUS/Urethra/Skene's glands:  Normal  Bladder:  Normal  Vagina:  Atrophic  Cervix:   Normal  Uterus:  normal in size, shape and contour.  Midline and mobile  Adnexa/parametria:     Rt: Without masses or tenderness.   Lt: Without masses or tenderness.  Anus and perineum: Normal  Digital rectal exam: Normal sphincter tone without palpated masses or tenderness  Assessment/Plan:  57 y.o. DWF G0 for annual exam with no complaints.  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 Home safety and fall prevention discussed and importance of regular exercise for bone health. Atelvia 35 weekly, prescription, proper use given and reviewed. Synthroid 50 mcg by mouth daily prescription, proper use given and reviewed. CBC, comprehensive metabolic panel, lipid panel, UA, Pap. Pap Normal 2012, new screening guidelines reviewed. GC/Chlamydia. Denies need for HIV, hepatitis or RPR.    Harrington Challenger [facility], 9:31 AM 05/10/2013

## 2013-05-11 LAB — GC/CHLAMYDIA PROBE AMP
CT Probe RNA: NEGATIVE
GC Probe RNA: NEGATIVE

## 2013-05-12 LAB — URINE CULTURE: Colony Count: 9000

## 2013-07-21 ENCOUNTER — Ambulatory Visit (INDEPENDENT_AMBULATORY_CARE_PROVIDER_SITE_OTHER): Payer: PRIVATE HEALTH INSURANCE | Admitting: Women's Health

## 2013-07-21 ENCOUNTER — Encounter: Payer: Self-pay | Admitting: Women's Health

## 2013-07-21 DIAGNOSIS — N898 Other specified noninflammatory disorders of vagina: Secondary | ICD-10-CM

## 2013-07-21 DIAGNOSIS — R319 Hematuria, unspecified: Secondary | ICD-10-CM

## 2013-07-21 DIAGNOSIS — N390 Urinary tract infection, site not specified: Secondary | ICD-10-CM

## 2013-07-21 LAB — URINALYSIS W MICROSCOPIC + REFLEX CULTURE
Bilirubin Urine: NEGATIVE
Casts: NONE SEEN
Crystals: NONE SEEN
GLUCOSE, UA: NEGATIVE mg/dL
Ketones, ur: NEGATIVE mg/dL
Nitrite: POSITIVE — AB
PROTEIN: 30 mg/dL — AB
Specific Gravity, Urine: 1.025 (ref 1.005–1.030)
Urobilinogen, UA: 0.2 mg/dL (ref 0.0–1.0)
pH: 5.5 (ref 5.0–8.0)

## 2013-07-21 LAB — WET PREP FOR TRICH, YEAST, CLUE
Clue Cells Wet Prep HPF POC: NONE SEEN
Trich, Wet Prep: NONE SEEN
Yeast Wet Prep HPF POC: NONE SEEN

## 2013-07-21 MED ORDER — CIPROFLOXACIN HCL 250 MG PO TABS: 250.0000 mg | ORAL_TABLET | Freq: Two times a day (BID) | ORAL | Status: DC

## 2013-07-21 NOTE — Progress Notes (Signed)
Patient ID: Grace Wood, female   DOB: June 23, 1955, 58 y.o.   MRN: [id number] Presents with complaint of urinary frequency, urgency, visible blood in urine, and questionable vaginal discharge. Denies abdominal pain, fever, back pain. Postmenopausal no HRT/no bleeding. Osteoporosis on Atelvia. History of recurrent UTIs in the past none in the last couple of years, hematuria with negative workup in the past.  Exam: Appears well. UA: Large blood, positive nitrites, moderate leukocytes, TNTC WBCs and many bacteria. External genitalia within normal limits, speculum exam scant discharge wet prep negative. Bimanual no CMT or adnexal fullness or tenderness.  UTI  Plan: Cipro 250 twice daily for 5 days, prescription, proper use given and reviewed. Instructed to call if no relief of symptoms.

## 2013-07-21 NOTE — Patient Instructions (Signed)
Urinary Tract Infection  Urinary tract infections (UTIs) can develop anywhere along your urinary tract. Your urinary tract is your body's drainage system for removing wastes and extra water. Your urinary tract includes two kidneys, two ureters, a bladder, and a urethra. Your kidneys are a pair of bean-shaped organs. Each kidney is about the size of your fist. They are located below your ribs, one on each side of your spine.  CAUSES  Infections are caused by microbes, which are microscopic organisms, including fungi, viruses, and bacteria. These organisms are so small that they can only be seen through a microscope. Bacteria are the microbes that most commonly cause UTIs.  SYMPTOMS   Symptoms of UTIs may vary by age and gender of the patient and by the location of the infection. Symptoms in Anu Stagner women typically include a frequent and intense urge to urinate and a painful, burning feeling in the bladder or urethra during urination. Older women and men are more likely to be tired, shaky, and weak and have muscle aches and abdominal pain. A fever may mean the infection is in your kidneys. Other symptoms of a kidney infection include pain in your back or sides below the ribs, nausea, and vomiting.  DIAGNOSIS  To diagnose a UTI, your caregiver will ask you about your symptoms. Your caregiver also will ask to provide a urine sample. The urine sample will be tested for bacteria and white blood cells. White blood cells are made by your body to help fight infection.  TREATMENT   Typically, UTIs can be treated with medication. Because most UTIs are caused by a bacterial infection, they usually can be treated with the use of antibiotics. The choice of antibiotic and length of treatment depend on your symptoms and the type of bacteria causing your infection.  HOME CARE INSTRUCTIONS   If you were prescribed antibiotics, take them exactly as your caregiver instructs you. Finish the medication even if you feel better after you  have only taken some of the medication.   Drink enough water and fluids to keep your urine clear or pale yellow.   Avoid caffeine, tea, and carbonated beverages. They tend to irritate your bladder.   Empty your bladder often. Avoid holding urine for long periods of time.   Empty your bladder before and after sexual intercourse.   After a bowel movement, women should cleanse from front to back. Use each tissue only once.  SEEK MEDICAL CARE IF:    You have back pain.   You develop a fever.   Your symptoms do not begin to resolve within 3 days.  SEEK IMMEDIATE MEDICAL CARE IF:    You have severe back pain or lower abdominal pain.   You develop chills.   You have nausea or vomiting.   You have continued burning or discomfort with urination.  MAKE SURE YOU:    Understand these instructions.   Will watch your condition.   Will get help right away if you are not doing well or get worse.  Document Released: 03/05/2005 Document Revised: 11/25/2011 Document Reviewed: 07/04/2011  ExitCare Patient Information 2014 [employer].

## 2013-07-22 ENCOUNTER — Ambulatory Visit: Payer: 59 | Admitting: Women's Health

## 2013-07-24 LAB — URINE CULTURE

## 2013-08-15 ENCOUNTER — Telehealth: Payer: Self-pay

## 2013-08-15 NOTE — Telephone Encounter (Signed)
Patient called inquiring regarding sample of Atelvia.  Per Sherrilyn RistKari we do currently have samples. She has placed an order but no idea when they will arrive. Patient informed of this.

## 2013-08-24 ENCOUNTER — Telehealth: Payer: Self-pay | Admitting: *Deleted

## 2013-08-24 NOTE — Telephone Encounter (Signed)
Left message on pt voicemail regarding this.

## 2013-08-24 NOTE — Telephone Encounter (Signed)
Message copied by Aura CampsWEBB, Amand Lemoine L on Wed Aug 24, 2013 11:05 AM ------      Message from: Richardson ChiquitoWILKINSON, KARI S      Created: Mon Aug 22, 2013  4:21 PM       Pt called back again, i know you had been talking to her about her medication Altevia. Seems like a PA is needed? Im not sure. But if you could follow up with her regarding this. Thanks kari ------

## 2013-08-25 NOTE — Telephone Encounter (Signed)
Prior authorization faxed for ATELVIA 35 MG to [employer] insurance company.

## 2013-08-30 NOTE — Telephone Encounter (Signed)
Grace Wood was denied by [employer] insurance, pt informed she is going to [employer] company.

## 2013-09-20 ENCOUNTER — Encounter: Payer: Self-pay | Admitting: *Deleted

## 2013-09-20 ENCOUNTER — Telehealth: Payer: Self-pay | Admitting: *Deleted

## 2013-09-20 NOTE — Telephone Encounter (Signed)
Pt medication for ATELVIA has been denied by [employer] insurance she would like to appeal denial. If you could let me know what you would like the letter to say, I will type up and send to [employer]. Please advise

## 2013-09-20 NOTE — Telephone Encounter (Signed)
Thanks, the letter needs to state she has osteoporosis and has been unable to tolerate Fosamax. Has done well on Atelvia, which also has a better risk reduction for fractures.  (ineed to look at specifics)

## 2013-09-20 NOTE — Telephone Encounter (Signed)
Letter printed and faxed to insurance company.

## 2013-09-20 NOTE — Telephone Encounter (Signed)
Letter needs to contain she has been on for 2 years and should be on 5. Strong family history hip fracture, also hypothyroid. DEXA stable on Atelvia, no further bone loss.

## 2013-09-21 NOTE — Telephone Encounter (Signed)
Appeal approved effective 09/20/13 to 06/23/16, pt informed, pharmacy informed as well.

## 2014-05-03 ENCOUNTER — Encounter: Payer: Self-pay | Admitting: Women's Health

## 2014-05-03 ENCOUNTER — Telehealth: Payer: Self-pay

## 2014-05-03 NOTE — Telephone Encounter (Signed)
Patient called stating she has been "losing weight like crazy".  She stated her weight was 124 and now is like 113-114.  She questioned if perhaps her Synthroid needed to be adjusted. She is coming in next week for her yearly exam and has now had TSH checked since last Nov.  I told her [place] is out until Dec 1 and her appt is Dec 3 so I thought it best she just wait and see [name] next week to discuss and have TSH checked.

## 2014-05-11 ENCOUNTER — Ambulatory Visit (INDEPENDENT_AMBULATORY_CARE_PROVIDER_SITE_OTHER): Payer: No Typology Code available for payment source | Admitting: Women's Health

## 2014-05-11 ENCOUNTER — Encounter: Payer: Self-pay | Admitting: Women's Health

## 2014-05-11 VITALS — BP 122/80 | Ht 61.0 in | Wt 116.0 lb

## 2014-05-11 DIAGNOSIS — Z13220 Encounter for screening for lipoid disorders: Secondary | ICD-10-CM

## 2014-05-11 DIAGNOSIS — E038 Other specified hypothyroidism: Secondary | ICD-10-CM

## 2014-05-11 DIAGNOSIS — M8580 Other specified disorders of bone density and structure, unspecified site: Secondary | ICD-10-CM

## 2014-05-11 DIAGNOSIS — Z01419 Encounter for gynecological examination (general) (routine) without abnormal findings: Secondary | ICD-10-CM

## 2014-05-11 LAB — COMPREHENSIVE METABOLIC PANEL
ALK PHOS: 53 U/L (ref 39–117)
ALT: 19 U/L (ref 0–35)
AST: 23 U/L (ref 0–37)
Albumin: 3.9 g/dL (ref 3.5–5.2)
BILIRUBIN TOTAL: 0.4 mg/dL (ref 0.2–1.2)
BUN: 15 mg/dL (ref 6–23)
CO2: 27 mEq/L (ref 19–32)
Calcium: 9 mg/dL (ref 8.4–10.5)
Chloride: 104 mEq/L (ref 96–112)
Creat: 0.67 mg/dL (ref 0.50–1.10)
GLUCOSE: 88 mg/dL (ref 70–99)
Potassium: 3.9 mEq/L (ref 3.5–5.3)
SODIUM: 140 meq/L (ref 135–145)
TOTAL PROTEIN: 7 g/dL (ref 6.0–8.3)

## 2014-05-11 LAB — CBC WITH DIFFERENTIAL/PLATELET
BASOS ABS: 0 10*3/uL (ref 0.0–0.1)
BASOS PCT: 0 % (ref 0–1)
Eosinophils Absolute: 0.3 10*3/uL (ref 0.0–0.7)
Eosinophils Relative: 4 % (ref 0–5)
HCT: 34.8 % — ABNORMAL LOW (ref 36.0–46.0)
Hemoglobin: 11.3 g/dL — ABNORMAL LOW (ref 12.0–15.0)
Lymphocytes Relative: 25 % (ref 12–46)
Lymphs Abs: 1.9 10*3/uL (ref 0.7–4.0)
MCH: 28.9 pg (ref 26.0–34.0)
MCHC: 32.5 g/dL (ref 30.0–36.0)
MCV: 89 fL (ref 78.0–100.0)
MONOS PCT: 8 % (ref 3–12)
MPV: 8.9 fL — ABNORMAL LOW (ref 9.4–12.4)
Monocytes Absolute: 0.6 10*3/uL (ref 0.1–1.0)
Neutro Abs: 4.7 10*3/uL (ref 1.7–7.7)
Neutrophils Relative %: 63 % (ref 43–77)
PLATELETS: 262 10*3/uL (ref 150–400)
RBC: 3.91 MIL/uL (ref 3.87–5.11)
RDW: 13.6 % (ref 11.5–15.5)
WBC: 7.4 10*3/uL (ref 4.0–10.5)

## 2014-05-11 LAB — TSH: TSH: 2.318 u[IU]/mL (ref 0.350–4.500)

## 2014-05-11 MED ORDER — RISEDRONATE SODIUM 35 MG PO TBEC: DELAYED_RELEASE_TABLET | ORAL | Status: DC

## 2014-05-11 MED ORDER — LEVOTHYROXINE SODIUM 50 MCG PO TABS: 50.0000 ug | ORAL_TABLET | Freq: Every day | ORAL | Status: DC

## 2014-05-11 NOTE — Progress Notes (Signed)
Grace ReilJudy A Wood [birth year] [id number]    History:    Presents for annual exam.  Postmenopausal/no HRT/no bleeding. Normal  mammogram history. 1984 cone, normal Paps after. Rare HSV. Premature menopause age 58. Hypothyroid on Synthroid. 2014 DEXA stable T score -2.1 femoral neck.  Had taken several doses of Prolia, currently on Atelvia. History of benign hematuria with negative workup with urologist. Not sexually active.  Past medical history, past surgical history, family history and social history were all reviewed and documented in the EPIC chart. Father died 2013 continues with estate dispute with family members.  ROS:  A  12 point ROS was performed and pertinent positives and negatives are included.  Exam:  Filed Vitals:   05/11/14 1412  BP: 122/80    General appearance:  Normal Thyroid:  Symmetrical, normal in size, without palpable masses or nodularity. Respiratory  Auscultation:  Clear without wheezing or rhonchi Cardiovascular  Auscultation:  Regular rate, without rubs, murmurs or gallops  Edema/varicosities:  Not grossly evident Abdominal  Soft,nontender, without masses, guarding or rebound.  Liver/spleen:  No organomegaly noted  Hernia:  None appreciated  Skin  Inspection:  Grossly normal   Breasts: Examined lying and sitting.     Right: Without masses, retractions, discharge or axillary adenopathy.     Left: Without masses, retractions, discharge or axillary adenopathy. Gentitourinary   Inguinal/mons:  Normal without inguinal adenopathy  External genitalia:  Normal  BUS/Urethra/Skene's glands:  Normal  Vagina:  Normal  Cervix:  Normal  Uterus:  normal in size, shape and contour.  Midline and mobile  Adnexa/parametria:     Rt: Without masses or tenderness.   Lt: Without masses or tenderness.  Anus and perineum: Normal  Digital rectal exam: Normal sphincter tone without palpated masses or tenderness  Assessment/Plan:  58 y.o. DWF G0 for annual exam.     Postmenopausal/no bleeding/no HRT Osteopenia stable DEXA HSV rare outbreaks Hypothyroid Benign hematuria negative urology workup  Plan: SBE's, continue annual mammogram, calcium rich diet, normal vitamin D level, continue same supplement. Home safety, fall prevention discussed. Atelvia 35 mg by mouth weekly prescription, proper use given and reviewed. Synthroid 50 mcg by mouth daily prescription, proper use given and reviewed. Valtrex 500 twice daily when necessary for 3-5 days prescription, proper use given and reviewed. CBC, TSH, conference and metabolic panel, TSH, UA, Pap normal 2014, new screening guidelines reviewed.    Harrington ChallengerYOUNG,Marin Milley J [facility] CenterWHNP, 5:41 PM 05/11/2014

## 2014-05-11 NOTE — Patient Instructions (Signed)

## 2014-05-12 LAB — URINALYSIS W MICROSCOPIC + REFLEX CULTURE
BILIRUBIN URINE: NEGATIVE
Bacteria, UA: NONE SEEN
CRYSTALS: NONE SEEN
Casts: NONE SEEN
Glucose, UA: NEGATIVE mg/dL
Ketones, ur: NEGATIVE mg/dL
Leukocytes, UA: NEGATIVE
Nitrite: NEGATIVE
Protein, ur: NEGATIVE mg/dL
SPECIFIC GRAVITY, URINE: 1.022 (ref 1.005–1.030)
Squamous Epithelial / LPF: NONE SEEN
Urobilinogen, UA: 1 mg/dL (ref 0.0–1.0)
pH: 8 (ref 5.0–8.0)

## 2014-05-12 LAB — VITAMIN D 25 HYDROXY (VIT D DEFICIENCY, FRACTURES): VIT D 25 HYDROXY: 24 ng/mL — AB (ref 30–100)

## 2014-05-16 ENCOUNTER — Other Ambulatory Visit: Payer: Self-pay | Admitting: Women's Health

## 2014-05-16 LAB — URINE CULTURE: Colony Count: 10000

## 2014-05-16 MED ORDER — VITAMIN D (ERGOCALCIFEROL) 1.25 MG (50000 UNIT) PO CAPS: 50000.0000 [IU] | ORAL_CAPSULE | ORAL | Status: DC

## 2014-06-29 ENCOUNTER — Encounter: Payer: Self-pay | Admitting: Women's Health

## 2014-06-29 ENCOUNTER — Ambulatory Visit (INDEPENDENT_AMBULATORY_CARE_PROVIDER_SITE_OTHER): Payer: No Typology Code available for payment source | Admitting: Women's Health

## 2014-06-29 VITALS — BP 126/80 | Wt 113.0 lb

## 2014-06-29 DIAGNOSIS — B373 Candidiasis of vulva and vagina: Secondary | ICD-10-CM

## 2014-06-29 DIAGNOSIS — B3731 Acute candidiasis of vulva and vagina: Secondary | ICD-10-CM

## 2014-06-29 DIAGNOSIS — M810 Age-related osteoporosis without current pathological fracture: Secondary | ICD-10-CM

## 2014-06-29 DIAGNOSIS — R350 Frequency of micturition: Secondary | ICD-10-CM

## 2014-06-29 LAB — URINALYSIS W MICROSCOPIC + REFLEX CULTURE
Bilirubin Urine: NEGATIVE
CASTS: NONE SEEN
Crystals: NONE SEEN
GLUCOSE, UA: NEGATIVE mg/dL
Ketones, ur: NEGATIVE mg/dL
Leukocytes, UA: NEGATIVE
Nitrite: NEGATIVE
PH: 6 (ref 5.0–8.0)
Specific Gravity, Urine: 1.02 (ref 1.005–1.030)
UROBILINOGEN UA: 0.2 mg/dL (ref 0.0–1.0)

## 2014-06-29 LAB — WET PREP FOR TRICH, YEAST, CLUE
Clue Cells Wet Prep HPF POC: NONE SEEN
Trich, Wet Prep: NONE SEEN
WBC WET PREP: NONE SEEN

## 2014-06-29 MED ORDER — FLUCONAZOLE 150 MG PO TABS: 150.0000 mg | ORAL_TABLET | Freq: Once | ORAL | Status: DC

## 2014-06-29 MED ORDER — RISEDRONATE SODIUM 150 MG PO TABS: 150.0000 mg | ORAL_TABLET | ORAL | Status: DC

## 2014-06-29 NOTE — Patient Instructions (Signed)

## 2014-06-29 NOTE — Addendum Note (Signed)
Addended by: Kem ParkinsonBARNES, Tyliek Timberman on: 06/29/2014 12:52 PM   Modules accepted: Orders

## 2014-06-29 NOTE — Progress Notes (Signed)
Presents with yellow odorous discharge, urinary frequency, burning with urination for 1 week.  Tried Monistat when symptoms started with no relief. Used 2 tablets of old Cipro from another doctor with no relief. Denies fever, abdominal pain, back pain, itching. Not sexually active. History of benign hematuria. Also states that Precious Gildingtelvia is too expensive now, $75. Dexa 2014 T score -2.1 right femoral neck.  Fosamax was not tolerated well. Postmenopausal no HRT/no bleeding/no abdominal pain.  Exam: Well appearing. External genitalia non erythematous, no visible discharge or odor noted. Declines speculum exam, dryness, Wet prep done with q tip: yeast rare  UA: Large blood, TNTC RBCs, few bacteria ,  yeast  Yeast vaginitis  Osteoporosis on Atelvia Benign hematuria  Plan: Diflucan 150 mg x1, 1 refill. Call if symptoms worsen or persist.  Actonel 150 every monthly, prescription, proper use given and reviewed. Samples unavailable for Atelvia,  will try generic Actonel if problems will call. Reviewed importance of regular exercise, calcium rich diet, vitamin D 50,000 weekly.   Urine culture pending.

## 2014-06-30 LAB — URINE CULTURE
Colony Count: NO GROWTH
Organism ID, Bacteria: NO GROWTH

## 2014-07-04 ENCOUNTER — Telehealth: Payer: Self-pay | Admitting: *Deleted

## 2014-07-04 NOTE — Telephone Encounter (Signed)
Pt called to follow up from OV 06/29/14 Actonel 150 is $200 per month and fosamax is not an option for pt as she not tolerated well. She asked if Prolia would be okay, she is going to call insurance company and get back with me. If this is an option I will relay to PAM to check benefit. Please advise

## 2014-07-04 NOTE — Telephone Encounter (Signed)
She had taken Prolia in the past but had an insurance problem. Prolia would be a good choice.

## 2014-07-05 NOTE — Telephone Encounter (Signed)
Pt informed, [facility].

## 2014-07-11 ENCOUNTER — Telehealth: Payer: Self-pay | Admitting: *Deleted

## 2014-07-11 DIAGNOSIS — N390 Urinary tract infection, site not specified: Secondary | ICD-10-CM

## 2014-07-11 NOTE — Telephone Encounter (Signed)
Pt called c/o frequent urination with very small amount of urine when using bathroom. Pt said no burning as noted in OV 07/04/14, advised OV best, pt is worry about paying co-pay. Recommendations?

## 2014-07-11 NOTE — Telephone Encounter (Signed)
Have her schedule lab appointment for UA only and I will call with results.

## 2014-07-11 NOTE — Telephone Encounter (Signed)
Pt left message 07/04/14 symptoms is worse, I left message for pt to call.

## 2014-07-12 NOTE — Telephone Encounter (Signed)
Pt informed, order placed.  

## 2014-07-13 ENCOUNTER — Other Ambulatory Visit: Payer: No Typology Code available for payment source

## 2014-07-13 DIAGNOSIS — N390 Urinary tract infection, site not specified: Secondary | ICD-10-CM

## 2014-07-14 ENCOUNTER — Other Ambulatory Visit: Payer: Self-pay | Admitting: Women's Health

## 2014-07-14 LAB — URINALYSIS W MICROSCOPIC + REFLEX CULTURE
Bilirubin Urine: NEGATIVE
Casts: NONE SEEN
Glucose, UA: NEGATIVE mg/dL
KETONES UR: NEGATIVE mg/dL
Nitrite: POSITIVE — AB
PH: 5.5 (ref 5.0–8.0)
Protein, ur: NEGATIVE mg/dL
Specific Gravity, Urine: 1.02 (ref 1.005–1.030)
Squamous Epithelial / LPF: NONE SEEN
UROBILINOGEN UA: 0.2 mg/dL (ref 0.0–1.0)

## 2014-07-14 MED ORDER — NITROFURANTOIN MONOHYD MACRO 100 MG PO CAPS: 100.0000 mg | ORAL_CAPSULE | Freq: Two times a day (BID) | ORAL | Status: DC

## 2014-07-14 MED ORDER — CIPROFLOXACIN HCL 250 MG PO TABS: 250.0000 mg | ORAL_TABLET | Freq: Two times a day (BID) | ORAL | Status: DC

## 2014-07-17 LAB — URINE CULTURE: Colony Count: >=100000

## 2014-07-27 ENCOUNTER — Encounter: Payer: Self-pay | Admitting: Women's Health

## 2014-07-27 ENCOUNTER — Ambulatory Visit (INDEPENDENT_AMBULATORY_CARE_PROVIDER_SITE_OTHER): Payer: No Typology Code available for payment source | Admitting: Women's Health

## 2014-07-27 ENCOUNTER — Telehealth: Payer: Self-pay | Admitting: Gynecology

## 2014-07-27 VITALS — BP 128/80 | Wt 116.0 lb

## 2014-07-27 DIAGNOSIS — R350 Frequency of micturition: Secondary | ICD-10-CM

## 2014-07-27 DIAGNOSIS — B3731 Acute candidiasis of vulva and vagina: Secondary | ICD-10-CM

## 2014-07-27 DIAGNOSIS — N3001 Acute cystitis with hematuria: Secondary | ICD-10-CM

## 2014-07-27 DIAGNOSIS — B373 Candidiasis of vulva and vagina: Secondary | ICD-10-CM

## 2014-07-27 LAB — URINALYSIS W MICROSCOPIC + REFLEX CULTURE
Bilirubin Urine: NEGATIVE
CASTS: NONE SEEN
CRYSTALS: NONE SEEN
Glucose, UA: NEGATIVE mg/dL
KETONES UR: NEGATIVE mg/dL
Nitrite: NEGATIVE
PH: 6 (ref 5.0–8.0)
Protein, ur: 30 mg/dL — AB
SPECIFIC GRAVITY, URINE: 1.02 (ref 1.005–1.030)
Urobilinogen, UA: 0.2 mg/dL (ref 0.0–1.0)

## 2014-07-27 MED ORDER — FLUCONAZOLE 150 MG PO TABS: 150.0000 mg | ORAL_TABLET | Freq: Once | ORAL | Status: DC

## 2014-07-27 MED ORDER — NITROFURANTOIN MONOHYD MACRO 100 MG PO CAPS: 100.0000 mg | ORAL_CAPSULE | Freq: Two times a day (BID) | ORAL | Status: AC

## 2014-07-27 NOTE — Progress Notes (Signed)
Patient ID: Grace ReilJudy A Wood, female   DOB: [birth year], 59 y.o.   MRN: [id number] Presents with complaint of urinary frequency, urgency, pain and burning and visible blood in urine for 1 day. Postmenopausal no HRT/no bleeding. Denies abdominal pain, fever, or vaginal discharge. Not sexually active.  Exam: Appears well. UA: Large blood,  moderate leukocytes, TNTC WBCs, 21-50 RBCs, few bacteria.  UTI with hematuria  Plan: Repeat clean-catch urine in 2 weeks to be sure hematuria resolves. Macrobid twice daily for 7 days #14, instructed to take with food. Call if no relief of symptoms. Aware of UTI prevention.

## 2014-07-27 NOTE — Patient Instructions (Signed)

## 2014-07-27 NOTE — Telephone Encounter (Signed)
Called and left message with [facility] , benefits for Prolia , Deductible is 1500.00 ($120 met). Once met 100%. Will offer Prolia co pay card benefit. Asked Sonita to return call.

## 2014-07-29 LAB — URINE CULTURE

## 2014-08-01 NOTE — Telephone Encounter (Signed)
Discussed the Prolia card reimbursement program with patient. I explained the prolia card and the benefits to her. She has a 1500.00 deductible ($120 met) , I explained that she will  Need to bring in the information from the insurance and also the bill from [place] when she receives both. If she does not bring in the information needed that we send to [facility] she will be billed for the injection.

## 2014-08-03 NOTE — Telephone Encounter (Signed)
Phone call to [employer] , approval card # [id number]  Group [id number]  [id number]. [facility] will mail card to patient. $1000.00 approval. Pt. Understands that she will submit bill and EOB from insurance to office. She will have injection on 08/08/2014. Called pt and informed her that card will be mailed to her by [employer].

## 2014-08-08 ENCOUNTER — Other Ambulatory Visit (INDEPENDENT_AMBULATORY_CARE_PROVIDER_SITE_OTHER): Payer: No Typology Code available for payment source | Admitting: *Deleted

## 2014-08-08 DIAGNOSIS — M810 Age-related osteoporosis without current pathological fracture: Secondary | ICD-10-CM

## 2014-08-08 MED ORDER — DENOSUMAB 60 MG/ML ~~LOC~~ SOLN
60.0000 mg | Freq: Once | SUBCUTANEOUS | Status: AC
  Administered 2014-08-08: 60 mg via SUBCUTANEOUS

## 2014-08-08 NOTE — Telephone Encounter (Addendum)
Grace Wood came in today for Prolia injection. She will bring in the documentation from insurance and [place] when she receives it due to instruction given on the Prolia card benefit. She stated that she may not be able to afford the 6 month injection which will be due after 02/09/2015 . I told her that [employer] only offers one time benefit of $1000.00 and that she could evaluate that in September. Calcium level 05/11/14   9.0.

## 2014-08-23 NOTE — Telephone Encounter (Addendum)
Phone call to MariannaJudy left message on voice mail. She will need to sign the [employer] form regarding her responsibility for returning required documents to file with Prolia benefit card and mail back to the office.  Darel HongJudy signed the Prolia form and mailed back in . Sent form to [employer] to be scanned.

## 2015-01-10 ENCOUNTER — Telehealth: Payer: Self-pay | Admitting: Gynecology

## 2015-01-10 NOTE — Telephone Encounter (Signed)
Left message for pt to call . Insurance has been terminated. Prolia due in September. Asked pt for new insurance information . Pt has Prolia benefit card also.

## 2015-01-23 NOTE — Telephone Encounter (Signed)
Left message regarding Prolia injection.

## 2015-01-30 NOTE — Telephone Encounter (Signed)
NO return phone call from pt.  Received information today reqarding her last Prolia injection, denied by insurance company due to no PA, insurance verified from [employer] stated NO PA required. Follow up for pt. Will send to [name] for help with the above problem.

## 2015-01-31 NOTE — Telephone Encounter (Signed)
Appeal in process. KW CMA

## 2015-02-20 NOTE — Telephone Encounter (Signed)
Left message on voice mail to return call Prolia due.

## 2015-04-09 ENCOUNTER — Telehealth: Payer: Self-pay | Admitting: *Deleted

## 2015-04-09 NOTE — Telephone Encounter (Signed)
ORDER FAXED TO [facility] FOR DEXA

## 2015-04-10 NOTE — Telephone Encounter (Addendum)
No insurance at this time. Applied for [employer]  Insurance from [employer] no card at this. Complete exam with [place] on 12/08 will try to schedule Prolia at this visit. Will check with Sherrilyn RistKari regarding appeal. Pt will call back with insurance information regarding Prolia injection

## 2015-04-11 NOTE — Telephone Encounter (Signed)
The latest information on the appeal for the Prolia injection is that it is still in the appeal Process. KW CMA

## 2015-05-09 ENCOUNTER — Encounter: Payer: Self-pay | Admitting: Women's Health

## 2015-05-14 ENCOUNTER — Encounter: Payer: Self-pay | Admitting: Women's Health

## 2015-05-15 ENCOUNTER — Encounter: Payer: Self-pay | Admitting: Women's Health

## 2015-05-17 ENCOUNTER — Ambulatory Visit (INDEPENDENT_AMBULATORY_CARE_PROVIDER_SITE_OTHER): Payer: PRIVATE HEALTH INSURANCE | Admitting: Women's Health

## 2015-05-17 ENCOUNTER — Encounter: Payer: Self-pay | Admitting: Women's Health

## 2015-05-17 ENCOUNTER — Other Ambulatory Visit (HOSPITAL_COMMUNITY)
Admission: RE | Admit: 2015-05-17 | Discharge: 2015-05-17 | Disposition: A | Discharge status: Home / Self Care | Payer: PRIVATE HEALTH INSURANCE | Source: Ambulatory Visit | Attending: Women's Health | Admitting: Women's Health

## 2015-05-17 VITALS — BP 132/80 | Ht 61.0 in | Wt 123.0 lb

## 2015-05-17 DIAGNOSIS — Z23 Encounter for immunization: Secondary | ICD-10-CM

## 2015-05-17 DIAGNOSIS — E038 Other specified hypothyroidism: Secondary | ICD-10-CM

## 2015-05-17 DIAGNOSIS — Z113 Encounter for screening for infections with a predominantly sexual mode of transmission: Secondary | ICD-10-CM | POA: Diagnosis present

## 2015-05-17 DIAGNOSIS — Z01419 Encounter for gynecological examination (general) (routine) without abnormal findings: Secondary | ICD-10-CM | POA: Diagnosis not present

## 2015-05-17 DIAGNOSIS — M810 Age-related osteoporosis without current pathological fracture: Secondary | ICD-10-CM | POA: Diagnosis not present

## 2015-05-17 DIAGNOSIS — Z13220 Encounter for screening for lipoid disorders: Secondary | ICD-10-CM

## 2015-05-17 DIAGNOSIS — B009 Herpesviral infection, unspecified: Secondary | ICD-10-CM

## 2015-05-17 LAB — CBC WITH DIFFERENTIAL/PLATELET
BASOS PCT: 0 % (ref 0–1)
Basophils Absolute: 0 10*3/uL (ref 0.0–0.1)
EOS ABS: 0.1 10*3/uL (ref 0.0–0.7)
EOS PCT: 2 % (ref 0–5)
HCT: 36 % (ref 36.0–46.0)
Hemoglobin: 12.1 g/dL (ref 12.0–15.0)
LYMPHS ABS: 1.7 10*3/uL (ref 0.7–4.0)
Lymphocytes Relative: 23 % (ref 12–46)
MCH: 29.6 pg (ref 26.0–34.0)
MCHC: 33.6 g/dL (ref 30.0–36.0)
MCV: 88 fL (ref 78.0–100.0)
MONO ABS: 0.7 10*3/uL (ref 0.1–1.0)
MONOS PCT: 9 % (ref 3–12)
MPV: 8.8 fL (ref 8.6–12.4)
NEUTROS PCT: 66 % (ref 43–77)
Neutro Abs: 4.9 10*3/uL (ref 1.7–7.7)
PLATELETS: 272 10*3/uL (ref 150–400)
RBC: 4.09 MIL/uL (ref 3.87–5.11)
RDW: 13.3 % (ref 11.5–15.5)
WBC: 7.4 10*3/uL (ref 4.0–10.5)

## 2015-05-17 LAB — COMPREHENSIVE METABOLIC PANEL
ALT: 27 U/L (ref 6–29)
AST: 31 U/L (ref 10–35)
Albumin: 4 g/dL (ref 3.6–5.1)
Alkaline Phosphatase: 54 U/L (ref 33–130)
BUN: 17 mg/dL (ref 7–25)
CHLORIDE: 102 mmol/L (ref 98–110)
CO2: 28 mmol/L (ref 20–31)
Calcium: 9.3 mg/dL (ref 8.6–10.4)
Creat: 0.72 mg/dL (ref 0.50–0.99)
GLUCOSE: 86 mg/dL (ref 65–99)
POTASSIUM: 3.9 mmol/L (ref 3.5–5.3)
Sodium: 139 mmol/L (ref 135–146)
TOTAL PROTEIN: 7.2 g/dL (ref 6.1–8.1)
Total Bilirubin: 0.6 mg/dL (ref 0.2–1.2)

## 2015-05-17 LAB — LIPID PANEL
CHOL/HDL RATIO: 2.4 ratio (ref <=5.0)
CHOLESTEROL: 176 mg/dL (ref 125–200)
HDL: 74 mg/dL (ref >=46)
LDL Cholesterol: 83 mg/dL (ref <130)
Triglycerides: 96 mg/dL (ref <150)
VLDL: 19 mg/dL (ref <30)

## 2015-05-17 LAB — URINALYSIS W MICROSCOPIC + REFLEX CULTURE
Bacteria, UA: NONE SEEN [HPF]
Bilirubin Urine: NEGATIVE
Casts: NONE SEEN [LPF]
Crystals: NONE SEEN [HPF]
Glucose, UA: NEGATIVE
Ketones, ur: NEGATIVE
LEUKOCYTES UA: NEGATIVE
NITRITE: NEGATIVE
PROTEIN: NEGATIVE
Specific Gravity, Urine: 1.023 (ref 1.001–1.035)
WBC UA: NONE SEEN WBC/HPF (ref <=5)
Yeast: NONE SEEN [HPF]
pH: 6 (ref 5.0–8.0)

## 2015-05-17 LAB — TSH: TSH: 2.007 u[IU]/mL (ref 0.350–4.500)

## 2015-05-17 MED ORDER — VALACYCLOVIR HCL 500 MG PO TABS: 500.0000 mg | ORAL_TABLET | Freq: Two times a day (BID) | ORAL | Status: DC

## 2015-05-17 MED ORDER — DENOSUMAB 60 MG/ML ~~LOC~~ SOLN
60.0000 mg | Freq: Once | SUBCUTANEOUS | Status: AC
  Administered 2015-05-17: 60 mg via SUBCUTANEOUS

## 2015-05-17 MED ORDER — LEVOTHYROXINE SODIUM 50 MCG PO TABS: 50.0000 ug | ORAL_TABLET | Freq: Every day | ORAL | Status: DC

## 2015-05-17 NOTE — Addendum Note (Signed)
Addended by: Kem ParkinsonBARNES, Makinlee Awwad on: 05/17/2015 12:02 PM   Modules accepted: Orders

## 2015-05-17 NOTE — Patient Instructions (Signed)
Denosumab injection What is this medicine? DENOSUMAB (den oh sue mab) slows bone breakdown. Prolia is used to treat osteoporosis in women after menopause and in men. Delton See is used to prevent bone fractures and other bone problems caused by cancer bone metastases. Delton See is also used to treat giant cell tumor of the bone. This medicine may be used for other purposes; ask your health care provider or pharmacist if you have questions. What should I tell my health care provider before I take this medicine? They need to know if you have any of these conditions: -dental disease -eczema -infection or history of infections -kidney disease or on dialysis -low blood calcium or vitamin D -malabsorption syndrome -scheduled to have surgery or tooth extraction -taking medicine that contains denosumab -thyroid or parathyroid disease -an unusual reaction to denosumab, other medicines, foods, dyes, or preservatives -pregnant or trying to get pregnant -breast-feeding How should I use this medicine? This medicine is for injection under the skin. It is given by a health care professional in a hospital or clinic setting. If you are getting Prolia, a special MedGuide will be given to you by the pharmacist with each prescription and refill. Be sure to read this information carefully each time. For Prolia, talk to your pediatrician regarding the use of this medicine in children. Special care may be needed. For Delton See, talk to your pediatrician regarding the use of this medicine in children. While this drug may be prescribed for children as Talal Fritchman as 13 years for selected conditions, precautions do apply. Overdosage: If you think you have taken too much of this medicine contact a poison control center or emergency room at once. NOTE: This medicine is only for you. Do not share this medicine with others. What if I miss a dose? It is important not to miss your dose. Call your doctor or health care professional if you are  unable to keep an appointment. What may interact with this medicine? Do not take this medicine with any of the following medications: -other medicines containing denosumab This medicine may also interact with the following medications: -medicines that suppress the immune system -medicines that treat cancer -steroid medicines like prednisone or cortisone This list may not describe all possible interactions. Give your health care provider a list of all the medicines, herbs, non-prescription drugs, or dietary supplements you use. Also tell them if you smoke, drink alcohol, or use illegal drugs. Some items may interact with your medicine. What should I watch for while using this medicine? Visit your doctor or health care professional for regular checks on your progress. Your doctor or health care professional may order blood tests and other tests to see how you are doing. Call your doctor or health care professional if you get a cold or other infection while receiving this medicine. Do not treat yourself. This medicine may decrease your body's ability to fight infection. You should make sure you get enough calcium and vitamin D while you are taking this medicine, unless your doctor tells you not to. Discuss the foods you eat and the vitamins you take with your health care professional. See your dentist regularly. Brush and floss your teeth as directed. Before you have any dental work done, tell your dentist you are receiving this medicine. Do not become pregnant while taking this medicine or for 5 months after stopping it. Women should inform their doctor if they wish to become pregnant or think they might be pregnant. There is a potential for serious side effects  to an unborn child. Talk to your health care professional or pharmacist for more information. What side effects may I notice from receiving this medicine? Side effects that you should report to your doctor or health care professional as soon as  possible: -allergic reactions like skin rash, itching or hives, swelling of the face, lips, or tongue -breathing problems -chest pain -fast, irregular heartbeat -feeling faint or lightheaded, falls -fever, chills, or any other sign of infection -muscle spasms, tightening, or twitches -numbness or tingling -skin blisters or bumps, or is dry, peels, or red -slow healing or unexplained pain in the mouth or jaw -unusual bleeding or bruising Side effects that usually do not require medical attention (Report these to your doctor or health care professional if they continue or are bothersome.): -muscle pain -stomach upset, gas This list may not describe all possible side effects. Call your doctor for medical advice about side effects. You may report side effects to FDA at 1-800-FDA-1088. Where should I keep my medicine? This medicine is only given in a clinic, doctor's office, or other health care setting and will not be stored at home. NOTE: This sheet is a summary. It may not cover all possible information. If you have questions about this medicine, talk to your doctor, pharmacist, or health care provider.    2016, Elsevier/Gold Standard. (2011-11-24 12:37:47) Osteoporosis Osteoporosis is the thinning and loss of density in the bones. Osteoporosis makes the bones more brittle, fragile, and likely to break (fracture). Over time, osteoporosis can cause the bones to become so weak that they fracture after a simple fall. The bones most likely to fracture are the bones in the hip, wrist, and spine. CAUSES  The exact cause is not known. RISK FACTORS Anyone can develop osteoporosis. You may be at greater risk if you have a family history of the condition or have poor nutrition. You may also have a higher risk if you are:   Female.   40 years old or older.  A smoker.  Not physically active.   White or Asian.  Slender. SIGNS AND SYMPTOMS  A fracture might be the first sign of the  disease, especially if it results from a fall or injury that would not usually cause a bone to break. Other signs and symptoms include:   Low back and neck pain.  Stooped posture.  Height loss. DIAGNOSIS  To make a diagnosis, your health care provider may:  Take a medical history.  Perform a physical exam.  Order tests, such as:  A bone mineral density test.  A dual-energy X-ray absorptiometry test. TREATMENT  The goal of osteoporosis treatment is to strengthen your bones to reduce your risk of a fracture. Treatment may involve:  Making lifestyle changes, such as:  Eating a diet rich in calcium.  Doing weight-bearing and muscle-strengthening exercises.  Stopping tobacco use.  Limiting alcohol intake.  Taking medicine to slow the process of bone loss or to increase bone density.  Monitoring your levels of calcium and vitamin D. HOME CARE INSTRUCTIONS  Include calcium and vitamin D in your diet. Calcium is important for bone health, and vitamin D helps the body absorb calcium.  Perform weight-bearing and muscle-strengthening exercises as directed by your health care provider.  Do not use any tobacco products, including cigarettes, chewing tobacco, and electronic cigarettes. If you need help quitting, ask your health care provider.  Limit your alcohol intake.  Take medicines only as directed by your health care provider.  Keep all follow-up  visits as directed by your health care provider. This is important.  Take precautions at home to lower your risk of falling, such as:  Keeping rooms well lit and clutter free.  Installing safety rails on stairs.  Using rubber mats in the bathroom and other areas that are often wet or slippery. SEEK IMMEDIATE MEDICAL CARE IF:  You fall or injure yourself.    This information is not intended to replace advice given to you by your health care provider. Make sure you discuss any questions you have with your health care  provider.   Document Released: 03/05/2005 Document Revised: 06/16/2014 Document Reviewed: 11/03/2013 Elsevier Interactive Patient Education [employer]. Menopause is a normal process in which your reproductive ability comes to an end. This process happens gradually over a span of months to years, usually between the ages of 59 and 66. Menopause is complete when you have missed 12 consecutive menstrual periods. It is important to talk with your health care provider about some of the most common conditions that affect postmenopausal women, such as heart disease, cancer, and bone loss (osteoporosis). Adopting a healthy lifestyle and getting preventive care can help to promote your health and wellness. Those actions can also lower your chances of developing some of these common conditions. WHAT SHOULD I KNOW ABOUT MENOPAUSE? During menopause, you may experience a number of symptoms, such as:  Moderate-to-severe hot flashes.  Night sweats.  Decrease in sex drive.  Mood swings.  Headaches.  Tiredness.  Irritability.  Memory problems.  Insomnia. Choosing to treat or not to treat menopausal changes is an individual decision that you make with your health care provider. WHAT SHOULD I KNOW ABOUT HORMONE REPLACEMENT THERAPY AND SUPPLEMENTS? Hormone therapy products are effective for treating symptoms that are associated with menopause, such as hot flashes and night sweats. Hormone replacement carries certain risks, especially as you become older. If you are thinking about using estrogen or estrogen with progestin treatments, discuss the benefits and risks with your health care provider. WHAT SHOULD I KNOW ABOUT HEART DISEASE AND STROKE? Heart disease, heart attack, and stroke become more likely as you age. This may be due, in part, to the hormonal changes that your body experiences during menopause. These can affect how your body processes dietary fats, triglycerides, and cholesterol. Heart  attack and stroke are both medical emergencies. There are many things that you can do to help prevent heart disease and stroke:  Have your blood pressure checked at least every 1-2 years. High blood pressure causes heart disease and increases the risk of stroke.  If you are 30-103 years old, ask your health care provider if you should take aspirin to prevent a heart attack or a stroke.  Do not use any tobacco products, including cigarettes, chewing tobacco, or electronic cigarettes. If you need help quitting, ask your health care provider.  It is important to eat a healthy diet and maintain a healthy weight.  Be sure to include plenty of vegetables, fruits, low-fat dairy products, and lean protein.  Avoid eating foods that are high in solid fats, added sugars, or salt (sodium).  Get regular exercise. This is one of the most important things that you can do for your health.  Try to exercise for at least 150 minutes each week. The type of exercise that you do should increase your heart rate and make you sweat. This is known as moderate-intensity exercise.  Try to do strengthening exercises at least twice each week. Do these  in addition to the moderate-intensity exercise.  Know your numbers.Ask your health care provider to check your cholesterol and your blood glucose. Continue to have your blood tested as directed by your health care provider. WHAT SHOULD I KNOW ABOUT CANCER SCREENING? There are several types of cancer. Take the following steps to reduce your risk and to catch any cancer development as early as possible. Breast Cancer  Practice breast self-awareness.  This means understanding how your breasts normally appear and feel.  It also means doing regular breast self-exams. Let your health care provider know about any changes, no matter how small.  If you are 9 or older, have a clinician do a breast exam (clinical breast exam or CBE) every year. Depending on your age, family  history, and medical history, it may be recommended that you also have a yearly breast X-ray (mammogram).  If you have a family history of breast cancer, talk with your health care provider about genetic screening.  If you are at high risk for breast cancer, talk with your health care provider about having an MRI and a mammogram every year.  Breast cancer (BRCA) gene test is recommended for women who have family members with BRCA-related cancers. Results of the assessment will determine the need for genetic counseling and BRCA1 and for BRCA2 testing. BRCA-related cancers include these types:  Breast. This occurs in males or females.  Ovarian.  Tubal. This may also be called fallopian tube cancer.  Cancer of the abdominal or pelvic lining (peritoneal cancer).  Prostate.  Pancreatic. Cervical, Uterine, and Ovarian Cancer Your health care provider may recommend that you be screened regularly for cancer of the pelvic organs. These include your ovaries, uterus, and vagina. This screening involves a pelvic exam, which includes checking for microscopic changes to the surface of your cervix (Pap test).  For women ages 21-65, health care providers may recommend a pelvic exam and a Pap test every three years. For women ages 29-65, they may recommend the Pap test and pelvic exam, combined with testing for human papilloma virus (HPV), every five years. Some types of HPV increase your risk of cervical cancer. Testing for HPV may also be done on women of any age who have unclear Pap test results.  Other health care providers may not recommend any screening for nonpregnant women who are considered low risk for pelvic cancer and have no symptoms. Ask your health care provider if a screening pelvic exam is right for you.  If you have had past treatment for cervical cancer or a condition that could lead to cancer, you need Pap tests and screening for cancer for at least 20 years after your treatment. If Pap  tests have been discontinued for you, your risk factors (such as having a new sexual partner) need to be reassessed to determine if you should start having screenings again. Some women have medical problems that increase the chance of getting cervical cancer. In these cases, your health care provider may recommend that you have screening and Pap tests more often.  If you have a family history of uterine cancer or ovarian cancer, talk with your health care provider about genetic screening.  If you have vaginal bleeding after reaching menopause, tell your health care provider.  There are currently no reliable tests available to screen for ovarian cancer. Lung Cancer Lung cancer screening is recommended for adults 36-46 years old who are at high risk for lung cancer because of a history of smoking. A yearly low-dose  CT scan of the lungs is recommended if you:  Currently smoke.  Have a history of at least 30 pack-years of smoking and you currently smoke or have quit within the past 15 years. A pack-year is smoking an average of one pack of cigarettes per day for one year. Yearly screening should:  Continue until it has been 15 years since you quit.  Stop if you develop a health problem that would prevent you from having lung cancer treatment. Colorectal Cancer  This type of cancer can be detected and can often be prevented.  Routine colorectal cancer screening usually begins at age 45 and continues through age 68.  If you have risk factors for colon cancer, your health care provider may recommend that you be screened at an earlier age.  If you have a family history of colorectal cancer, talk with your health care provider about genetic screening.  Your health care provider may also recommend using home test kits to check for hidden blood in your stool.  A small camera at the end of a tube can be used to examine your colon directly (sigmoidoscopy or colonoscopy). This is done to check for  the earliest forms of colorectal cancer.  Direct examination of the colon should be repeated every 5-10 years until age 47. However, if early forms of precancerous polyps or small growths are found or if you have a family history or genetic risk for colorectal cancer, you may need to be screened more often. Skin Cancer  Check your skin from head to toe regularly.  Monitor any moles. Be sure to tell your health care provider:  About any new moles or changes in moles, especially if there is a change in a mole's shape or color.  If you have a mole that is larger than the size of a pencil eraser.  If any of your family members has a history of skin cancer, especially at a Temperance Kelemen age, talk with your health care provider about genetic screening.  Always use sunscreen. Apply sunscreen liberally and repeatedly throughout the day.  Whenever you are outside, protect yourself by wearing long sleeves, pants, a wide-brimmed hat, and sunglasses. WHAT SHOULD I KNOW ABOUT OSTEOPOROSIS? Osteoporosis is a condition in which bone destruction happens more quickly than new bone creation. After menopause, you may be at an increased risk for osteoporosis. To help prevent osteoporosis or the bone fractures that can happen because of osteoporosis, the following is recommended:  If you are 1-32 years old, get at least 1,000 mg of calcium and at least 600 mg of vitamin D per day.  If you are older than age 14 but younger than age 73, get at least 1,200 mg of calcium and at least 600 mg of vitamin D per day.  If you are older than age 47, get at least 1,200 mg of calcium and at least 800 mg of vitamin D per day. Smoking and excessive alcohol intake increase the risk of osteoporosis. Eat foods that are rich in calcium and vitamin D, and do weight-bearing exercises several times each week as directed by your health care provider. WHAT SHOULD I KNOW ABOUT HOW MENOPAUSE AFFECTS [place]? Depression may occur at  any age, but it is more common as you become older. Common symptoms of depression include:  Low or sad mood.  Changes in sleep patterns.  Changes in appetite or eating patterns.  Feeling an overall lack of motivation or enjoyment of activities that you previously enjoyed.  Frequent crying spells. Talk with your health care provider if you think that you are experiencing depression. WHAT SHOULD I KNOW ABOUT IMMUNIZATIONS? It is important that you get and maintain your immunizations. These include:  Tetanus, diphtheria, and pertussis (Tdap) booster vaccine.  Influenza every year before the flu season begins.  Pneumonia vaccine.  Shingles vaccine. Your health care provider may also recommend other immunizations.   This information is not intended to replace advice given to you by your health care provider. Make sure you discuss any questions you have with your health care provider.   Document Released: 07/18/2005 Document Revised: 06/16/2014 Document Reviewed: 01/26/2014 Elsevier Interactive Patient Education [employer].

## 2015-05-17 NOTE — Addendum Note (Signed)
Addended by: Kem ParkinsonBARNES, Ova Gillentine on: 05/17/2015 12:56 PM   Modules accepted: Orders

## 2015-05-17 NOTE — Progress Notes (Signed)
Grace Wood 1955/12/29 [id number]    History:    Presents for annual exam.  Postmenopausal/no HRT/no bleeding. New partner. 1984 cone with normal Paps after. Normal mammograms, normal follow-up after mammogram 05/2015. Osteoporosis had been on Atelvia second dose of Prolia today. DEXA 2016 T score  right femoral neck -3.1.  No improvement with some loss from 2014 DEXA Atelvia  for one year, now on Prolia. Rare HSV. Premature menopause age 59. Hypothyroid on Synthroid 50. Negative colonoscopy 2007  Past medical history, past surgical history, family history and social history were all reviewed and documented in the EPIC chart. Works 3 jobs. Father died 2013 estate/family issues persist.   ROS:  A ROS was performed and pertinent positives and negatives are included.  Exam:  Filed Vitals:   05/17/15 1008  BP: 132/80    General appearance:  Normal Thyroid:  Symmetrical, normal in size, without palpable masses or nodularity. Respiratory  Auscultation:  Clear without wheezing or rhonchi Cardiovascular  Auscultation:  Regular rate, without rubs, murmurs or gallops  Edema/varicosities:  Not grossly evident Abdominal  Soft,nontender, without masses, guarding or rebound.  Liver/spleen:  No organomegaly noted  Hernia:  None appreciated  Skin  Inspection:  Grossly normal   Breasts: Examined lying and sitting.     Right: Without masses, retractions, discharge or axillary adenopathy.     Left: Without masses, retractions, discharge or axillary adenopathy. Gentitourinary   Inguinal/mons:  Normal without inguinal adenopathy  External genitalia:  Normal  BUS/Urethra/Skene's glands:  Normal  Vagina:   atrophic  Cervix:  Normal  Uterus:  normal in size, shape and contour.  Midline and mobile  Adnexa/parametria:     Rt: Without masses or tenderness.   Lt: Without masses or tenderness.  Anus and perineum: Normal  Digital rectal exam: Normal sphincter tone without palpated masses or  tenderness  Assessment/Plan:  59 y.o. D WF G0 for annual exam with no complaints.  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 Home safety, fall prevention and importance of regular exercise reviewed. Prolia 60 mg IM every 6 months second dose given today. Vitamin D 2000 daily will check vitamin D level also today. CBC, CMP, lipid panel, vitamin D, TSH, UA, Pap with HR HPV typing, new screening guidelines reviewed. GC/Chlamydia, HIV, hep B, C, RPR. Valtrex 500 twice daily for 3-5 days as needed when necessary prescription, proper use given and reviewed. Synthroid 50 mg by mouth daily proper use given and reviewed. Repeat colonoscopy next year.    Harrington ChallengerYOUNG,Grace Wood [facility]NP, 10:57 AM 05/17/2015

## 2015-05-17 NOTE — Addendum Note (Signed)
Addended by: Kem ParkinsonBARNES, Marceil Welp on: 05/17/2015 12:14 PM   Modules accepted: Orders

## 2015-05-18 ENCOUNTER — Other Ambulatory Visit: Payer: Self-pay | Admitting: Women's Health

## 2015-05-18 LAB — HEPATITIS B SURFACE ANTIGEN: Hepatitis B Surface Ag: NEGATIVE

## 2015-05-18 LAB — HEPATITIS C ANTIBODY: HCV AB: NEGATIVE

## 2015-05-18 LAB — RPR

## 2015-05-18 LAB — VITAMIN D 25 HYDROXY (VIT D DEFICIENCY, FRACTURES): Vit D, 25-Hydroxy: 27 ng/mL — ABNORMAL LOW (ref 30–100)

## 2015-05-18 LAB — HIV ANTIBODY (ROUTINE TESTING W REFLEX): HIV: NONREACTIVE

## 2015-05-18 MED ORDER — VITAMIN D (ERGOCALCIFEROL) 1.25 MG (50000 UNIT) PO CAPS: 50000.0000 [IU] | ORAL_CAPSULE | ORAL | Status: DC

## 2015-05-19 LAB — URINE CULTURE
COLONY COUNT: NO GROWTH
Organism ID, Bacteria: NO GROWTH

## 2015-05-21 LAB — CYTOLOGY - PAP

## 2015-05-23 NOTE — Telephone Encounter (Signed)
Still waiting for a final decision. KW CMA

## 2015-05-29 ENCOUNTER — Telehealth: Payer: Self-pay | Admitting: Gynecology

## 2015-05-29 NOTE — Telephone Encounter (Signed)
Prolia injection given at her complete exam with Maryelizabeth RowanNancy Young 05/17/15 . Next injection due after 11/16/15.  Calcium level 05/17/15 9.3 WNL Telephone call to [facility] regarding Prolia injection benefits. Benefit per telephone call are deductible $500. No copay , No co insurance. Prolia covered if given at time of annual exam .

## 2015-06-26 NOTE — Telephone Encounter (Signed)
Received insurance benefit  Deductible $500 ($354 met). No co pay, No co insurance No PA required. Calcium level 9.3 on 05/17/15 ,, Prolia given on 05/17/2015 next injection due after 11/16/15

## 2015-06-28 ENCOUNTER — Encounter: Payer: Self-pay | Admitting: Gynecology

## 2015-08-15 NOTE — Telephone Encounter (Signed)
Case closed KW CMA

## 2015-10-30 ENCOUNTER — Telehealth: Payer: Self-pay | Admitting: Gynecology

## 2015-10-30 NOTE — Telephone Encounter (Signed)
PC confirmed [employer] will check Prolia benefits , injection due 11/16/2015  Calcium 9.3 05/17/2015

## 2015-10-31 NOTE — Telephone Encounter (Signed)
Prolia will refile insurance current information to be verified per pt request

## 2015-11-06 NOTE — Telephone Encounter (Signed)
Second request for refile Prolia

## 2015-11-12 NOTE — Telephone Encounter (Signed)
Third request to re check benefits

## 2015-11-13 NOTE — Telephone Encounter (Signed)
Insurance benefits Deductible $500 ( $60 met)  PA needed. History of Prolia card. All information faxed to [employer]

## 2015-11-20 NOTE — Telephone Encounter (Signed)
PC from pt question about Prolia she will renew Prolia card and call her insurance regarding PA approval.

## 2015-11-26 NOTE — Telephone Encounter (Signed)
PC to pt , Insurance coverage for Prolia injection 05/2015. We will call her back to schedule next Prolia injection. She has renewed her Prolia card.

## 2015-12-17 NOTE — Telephone Encounter (Signed)
Will call and submit new request for Prolia to insurance company due to previous PA denial.

## 2015-12-24 ENCOUNTER — Ambulatory Visit (INDEPENDENT_AMBULATORY_CARE_PROVIDER_SITE_OTHER): Payer: Self-pay | Admitting: Physician Assistant

## 2015-12-24 VITALS — BP 108/78 | HR 83 | Temp 98.1°F | Resp 18 | Ht 61.0 in | Wt 124.0 lb

## 2015-12-24 DIAGNOSIS — R350 Frequency of micturition: Secondary | ICD-10-CM

## 2015-12-24 DIAGNOSIS — N3090 Cystitis, unspecified without hematuria: Secondary | ICD-10-CM

## 2015-12-24 DIAGNOSIS — N898 Other specified noninflammatory disorders of vagina: Secondary | ICD-10-CM

## 2015-12-24 DIAGNOSIS — N952 Postmenopausal atrophic vaginitis: Secondary | ICD-10-CM

## 2015-12-24 LAB — POC MICROSCOPIC URINALYSIS (UMFC): MUCUS RE: ABSENT

## 2015-12-24 LAB — POCT WET + KOH PREP
Trich by wet prep: ABSENT
Yeast by KOH: ABSENT
Yeast by wet prep: ABSENT

## 2015-12-24 LAB — POCT URINALYSIS DIP (MANUAL ENTRY)
Bilirubin, UA: NEGATIVE
Glucose, UA: NEGATIVE
Nitrite, UA: NEGATIVE
PH UA: 5
PROTEIN UA: NEGATIVE
Spec Grav, UA: >=1.03
Urobilinogen, UA: 0.2

## 2015-12-24 MED ORDER — CIPROFLOXACIN HCL 500 MG PO TABS: 500.0000 mg | ORAL_TABLET | Freq: Two times a day (BID) | ORAL | Status: DC

## 2015-12-24 NOTE — Patient Instructions (Addendum)
cipro twice a day for 3 days. Drink lots of water. I will let you know about your urine culture. Start using lubricant for sex At your next visit with GYN discuss premarin.    IF you received an x-ray today, you will receive an invoice from [facility]. Please contact [facility] at [phone] with questions or concerns regarding your invoice.   IF you received labwork today, you will receive an invoice from [facility]. Please contact [facility] at [phone] with questions or concerns regarding your invoice.   Our billing staff will not be able to assist you with questions regarding bills from these companies.  You will be contacted with the lab results as soon as they are available. The fastest way to get your results is to activate your My Chart account. Instructions are located on the last page of this paperwork. If you have not heard from us regarding the results in 2 weeks, please contact this office.

## 2015-12-24 NOTE — Progress Notes (Signed)
[facility] [address], [place] [address] [phone]  Date:  12/24/2015   Name:  Grace Wood   DOB:  1956/01/07   MRN:  [id number]  PCP:  Carmelina Dane, MD    Chief Complaint: Urinary Frequency   History of Present Illness:  This is a 60 y.o. female with PMH recurrent UTIs, mitral valve prolapse who is presenting with urinary frequency and urgency x 3-4 days. Denies dysuria. Mild pressure in lower abdomen that started today.  Vaginal discharge: white/yellow. Different that usual. Hematuria: no but she states when she gets UTIs she does usually have blood in her urine Fever/chills: no Nausea/vominting: no Back pain: no Sexually active: yes. Has an on and off partner. He lives in another city so they only see each other a few times a month. Previous UTI: last UTI 7 months ago at GYN office. Treated with 3 days of cipro. Urine culture without growth  Has seen Dr. Vonita Moss, urologist, but states she does want to go back to see him. GYN is Maryelizabeth Rowan. She states she has had problems with what sounds like bladder/ureteral stenosis? She has had to have 2 dilations in the past.  She is wondering if it is normal to have a burning sensation in her vagina after sex. She does state that sexual intercourse can be painful. She does have vaginal dryness. She went through menopause in her 43s. She had 11 years of hot flashes and that has ended at this point.   Review of Systems:  Review of Systems See HPI  Patient Active Problem List   Diagnosis Date Noted  . Osteoporosis 04/30/2012  . Mitral valve prolapse   . DEPRESSION 06/22/2007  . MITRAL REGURGITATION 06/22/2007  . UTI'S, RECURRENT 06/22/2007  . 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 BOILS, RECURRENT 06/22/2007    Prior to Admission medications   Medication Sig Start Date End Date Taking? Authorizing Provider  Ascorbic Acid (VITAMIN C) 1000 MG tablet Take 1,000 mg by mouth daily.   Yes  Historical Provider, MD  calcium carbonate (CALCIUM 600) 600 MG TABS tablet Take 600 mg by mouth daily.   Yes Historical Provider, MD  ferrous sulfate 325 (65 FE) MG tablet Take 325 mg by mouth daily.   Yes Historical Provider, MD  fexofenadine (ALLEGRA) 180 MG tablet Take 180 mg by mouth daily.   Yes Historical Provider, MD  levothyroxine (SYNTHROID, LEVOTHROID) 50 MCG tablet Take 1 tablet (50 mcg total) by mouth daily. 05/17/15  Yes Harrington Challenger, NP  valACYclovir (VALTREX) 500 MG tablet Take 1 tablet (500 mg total) by mouth 2 (two) times daily. 05/17/15  Yes Harrington Challenger, NP  Vitamin D, Ergocalciferol, (DRISDOL) 50000 UNITS CAPS capsule Take 1 capsule (50,000 Units total) by mouth every 7 (seven) days. 05/16/14  Yes Harrington Challenger, NP  ipratropium (ATROVENT) 0.06 % nasal spray Place 2 sprays into the nose 3 (three) times daily. 11/07/11 05/17/15  Sondra Barges, PA-C    Allergies  Allergen Reactions  . Hydrocodone-Acetaminophen   . Propoxyphene N-Acetaminophen   . Septra [Bactrim]   . Sulfonamide Derivatives     Past Surgical History  Procedure Laterality Date  . Dilation and curettage of uterus    . Cervical cone biopsy  1984  . Hyperextension of bladder  85,95    Social History  Substance Use Topics  . Smoking status: Former Games developer  . Smokeless tobacco: Never Used  Comment: Quit 2007  . Alcohol Use: No    Family History  Problem Relation Age of Onset  . Cancer Mother     HODGKINS  . Heart disease Mother   . Hypertension Brother   . Heart disease Paternal Aunt   . Breast cancer Paternal Aunt   . Heart disease Maternal Grandmother     Medication list has been reviewed and updated.  Physical Examination:  Physical Exam  Constitutional: She is oriented to person, place, and time. She appears well-developed and well-nourished. No distress.  HENT:  Head: Normocephalic and atraumatic.  Right Ear: Hearing normal.  Left Ear: Hearing normal.  Nose: Nose normal.  Eyes:  Conjunctivae and lids are normal. Right eye exhibits no discharge. Left eye exhibits no discharge. No scleral icterus.  Cardiovascular: Normal rate, regular rhythm, normal heart sounds and normal pulses.   No murmur heard. Pulmonary/Chest: Effort normal and breath sounds normal. No respiratory distress. She has no wheezes. She has no rhonchi. She has no rales.  Abdominal: Soft. Normal appearance. There is no tenderness. There is no CVA tenderness.  Genitourinary: Uterus normal. There is no lesion on the right labia. There is no lesion on the left labia. Cervix exhibits no motion tenderness. Right adnexum displays no tenderness. Left adnexum displays no tenderness. No bleeding in the vagina. No vaginal discharge found.  Pain with speculum exam so only did manual exam. Vaginal atrophy present.  Musculoskeletal: Normal range of motion.  Neurological: She is alert and oriented to person, place, and time.  Skin: Skin is warm, dry and intact. No lesion and no rash noted.  Psychiatric: She has a normal mood and affect. Her speech is normal and behavior is normal. Thought content normal.    BP 108/78 mmHg  Pulse 83  Temp(Src) 98.1 F (36.7 C) (Oral)  Resp 18  Ht 5\' 1"  (1.549 m)  Wt 124 lb (56.246 kg)  BMI 23.44 kg/m2  SpO2 99%  LMP 04/28/1997  Results for orders placed or performed in visit on 12/24/15  POCT Microscopic Urinalysis ([facility])  Result Value Ref Range   WBC,UR,HPF,POC Moderate (A) None WBC/hpf   RBC,UR,HPF,POC None None RBC/hpf   Bacteria Moderate (A) None, Too numerous to count   Mucus Absent Absent   Epithelial Cells, UR Per Microscopy Few (A) None, Too numerous to count cells/hpf  POCT urinalysis dipstick  Result Value Ref Range   Color, UA yellow yellow   Clarity, UA clear clear   Glucose, UA negative negative   Bilirubin, UA negative negative   Ketones, POC UA trace (5) (A) negative   Spec Grav, UA >=1.030    Blood, UA moderate (A) negative   pH, UA 5.0    Protein Ur,  POC negative negative   Urobilinogen, UA 0.2    Nitrite, UA Negative Negative   Leukocytes, UA Trace (A) Negative  POCT Wet + KOH Prep  Result Value Ref Range   Yeast by KOH Absent Present, Absent   Yeast by wet prep Absent Present, Absent   WBC by wet prep Many (A) None, Few, Too numerous to count   Clue Cells Wet Prep HPF POC Few (A) None, Too numerous to count   Trich by wet prep Absent Present, Absent   Bacteria Wet Prep HPF POC Moderate (A) None, Few, Too numerous to count   Epithelial Cells By Principal FinancialWet Pref ([facility]) Many (A) None, Few, Too numerous to count   RBC,UR,HPF,POC None None RBC/hpf    Assessment and Plan:  1. Cystitis 2. Urinary frequency 3. Vaginal discharge 4. Vaginal atrophy Moderate wbc and mod wbc on UA. Urine culture pending. Treat with cipro BID x 3 days. Few clues on wet prep however no visible discharge or odor. Part of problem could be atrophic vaginitis. She does admit to pain and dryness during intercourse. I have advised pt to use lubricant during sex. We also discussed premarin cream. She will f/u with GYN if her symptoms are not improving to discuss premarin rx. She may also need to f/u with urology since she has a hx of bladder/ureteral stenosis. She does not want to be referred at this time d/t finances. Return precautions discussed. - ciprofloxacin (CIPRO) 500 MG tablet; Take 1 tablet (500 mg total) by mouth 2 (two) times daily.  Dispense: 6 tablet; Refill: 0 - Urine culture - POCT Microscopic Urinalysis ([facility]) - POCT urinalysis dipstick - POCT Wet + KOH Prep - GC/Chlamydia Probe Amp    [place] Miners. Dyke Brackett, MHS [facility]  12/24/2015

## 2015-12-25 LAB — GC/CHLAMYDIA PROBE AMP
CT Probe RNA: NOT DETECTED
GC Probe RNA: NOT DETECTED

## 2015-12-26 NOTE — Telephone Encounter (Signed)
PC from [place] regarding Prolia injection due . She received Prolia information for debit card for Prolia she will apply for card and I will send call [employer] for approval for Prolia.

## 2015-12-27 LAB — URINE CULTURE

## 2015-12-30 ENCOUNTER — Telehealth: Payer: Self-pay | Admitting: Physician Assistant

## 2015-12-30 DIAGNOSIS — N3090 Cystitis, unspecified without hematuria: Secondary | ICD-10-CM

## 2015-12-30 MED ORDER — NITROFURANTOIN MONOHYD MACRO 100 MG PO CAPS: 100.0000 mg | ORAL_CAPSULE | Freq: Two times a day (BID) | ORAL | 0 refills | Status: AC

## 2015-12-30 NOTE — Telephone Encounter (Signed)
Urine culture showed resistance to cipro. Called patient, symptoms still present. Sensitive to macrobid, sent rx to pharmacy. Return if symptoms do not improve or if at any time symptoms worsen.

## 2016-01-01 NOTE — Telephone Encounter (Signed)
Talked with Grace Wood again , sending in letter explaining that Prolia is not a maintenance drug and is a Treatment for osteoporosis. Will fax information . [name] has received her Prolia benefit again.

## 2016-01-08 NOTE — Telephone Encounter (Signed)
Immediate denial received by phone call to [facility] from [facility] per medical director of this plan. Delisa of [employer] stated denial  Due to Wellsite geologist determined that Prolia is not covered and is a maintenance drug under [employer]. I called Rahima and explained that she would still have her Prolia card benefit of $1500 which would leave her a balance of approx $600. She wants to know if the Prolia is helping based on her last bone density report and also what other choices are available to her due to the cost of Prolia and she is on a limited income. I will forward this message to Maryelizabeth Rowan her provider.

## 2016-01-10 NOTE — Telephone Encounter (Signed)
Message left to call

## 2016-01-11 ENCOUNTER — Other Ambulatory Visit: Payer: Self-pay | Admitting: Women's Health

## 2016-01-11 DIAGNOSIS — M810 Age-related osteoporosis without current pathological fracture: Secondary | ICD-10-CM

## 2016-01-11 MED ORDER — ALENDRONATE SODIUM 70 MG PO TABS: 70.0000 mg | ORAL_TABLET | ORAL | 11 refills | Status: DC

## 2016-01-11 NOTE — Telephone Encounter (Signed)
Please check for coverage for reclast for osteoporosis.  Telephone call to Grace Wood, she is currently exercising on a regular basis. Had continued bone loss on Fosamax, Actonel and Boniva. She will start back on Fosamax 70 mg weekly Rx called in. Prolia was not covered, she has had continued bone loss on the Prolia, she's been on Prolia 2 years, last DEXA in 2016 showed a 21% decrease in the right femoral neck, 11% decrease on the left femoral neck. Spine was stable.

## 2016-01-14 NOTE — Telephone Encounter (Signed)
Called [facility] and they denied Reclast also per [facility] and prior information with letter from Dr Lily PeerFernandez. I called pt and informed her they denied Reclast per [employer] this is considered a maintenance  long term RX and it will not be covered under her plan. She will start Fosamax per Maryelizabeth RowanNancy Young note.

## 2016-05-12 ENCOUNTER — Encounter: Payer: Self-pay | Admitting: Women's Health

## 2016-05-21 ENCOUNTER — Ambulatory Visit (INDEPENDENT_AMBULATORY_CARE_PROVIDER_SITE_OTHER): Payer: PRIVATE HEALTH INSURANCE | Admitting: Women's Health

## 2016-05-21 ENCOUNTER — Encounter: Payer: Self-pay | Admitting: Women's Health

## 2016-05-21 VITALS — BP 122/80 | Ht 61.0 in | Wt 128.0 lb

## 2016-05-21 DIAGNOSIS — M810 Age-related osteoporosis without current pathological fracture: Secondary | ICD-10-CM

## 2016-05-21 DIAGNOSIS — B009 Herpesviral infection, unspecified: Secondary | ICD-10-CM | POA: Diagnosis not present

## 2016-05-21 DIAGNOSIS — Z01419 Encounter for gynecological examination (general) (routine) without abnormal findings: Secondary | ICD-10-CM | POA: Diagnosis not present

## 2016-05-21 DIAGNOSIS — Z13220 Encounter for screening for lipoid disorders: Secondary | ICD-10-CM | POA: Diagnosis not present

## 2016-05-21 DIAGNOSIS — N390 Urinary tract infection, site not specified: Secondary | ICD-10-CM

## 2016-05-21 DIAGNOSIS — E038 Other specified hypothyroidism: Secondary | ICD-10-CM

## 2016-05-21 DIAGNOSIS — Z1329 Encounter for screening for other suspected endocrine disorder: Secondary | ICD-10-CM | POA: Diagnosis not present

## 2016-05-21 MED ORDER — LEVOTHYROXINE SODIUM 50 MCG PO TABS: 50.0000 ug | ORAL_TABLET | Freq: Every day | ORAL | 4 refills | Status: DC

## 2016-05-21 MED ORDER — ALENDRONATE SODIUM 70 MG PO TABS: 70.0000 mg | ORAL_TABLET | ORAL | 11 refills | Status: DC

## 2016-05-21 MED ORDER — NITROFURANTOIN MACROCRYSTAL 50 MG PO CAPS: ORAL_CAPSULE | ORAL | 1 refills | Status: DC

## 2016-05-21 MED ORDER — VALACYCLOVIR HCL 500 MG PO TABS: 500.0000 mg | ORAL_TABLET | Freq: Two times a day (BID) | ORAL | 12 refills | Status: DC

## 2016-05-21 NOTE — Patient Instructions (Signed)

## 2016-05-21 NOTE — Progress Notes (Signed)
Grace Wood April 30, 1956 [id number]    History:    Presents for annual exam. Postmenopausal on no HRT with no bleeding. Reports recurrent UTIs occurring after intercourse.  1984 LEEP cone with normal Paps after. 2016 DEXA right femur -1.9 femoral neck -2.7 right femoral neck -3.1. 2015 had been on Atelvia for one year, 2 doses of Prolia in 2016 and has been on Fosamax this past year. Has not had Zostavax. 2007 negative colonoscopy. HSV rare outbreaks. Normal mammogram history. Hypothyroid on 50 g. Same partner negative STD screen.  Past medical history, past surgical history, family history and social history were all reviewed and documented in the EPIC chart. Works 2 jobs. Mother deceased non-Hodgkin's lymphoma, heart disease, father died of old age.   ROS:  A ROS was performed and pertinent positives and negatives are included.  Exam:  Vitals:   05/21/16 0848  BP: 122/80  Weight: 128 lb (58.1 kg)  Height: 5\' 1"  (1.549 m)   Body mass index is 24.19 kg/m.   General appearance:  Normal Thyroid:  Symmetrical, normal in size, without palpable masses or nodularity. Respiratory  Auscultation:  Clear without wheezing or rhonchi Cardiovascular  Auscultation:  Regular rate, without rubs, murmurs or gallops  Edema/varicosities:  Not grossly evident Abdominal  Soft,nontender, without masses, guarding or rebound.  Liver/spleen:  No organomegaly noted  Hernia:  None appreciated  Skin  Inspection:  Grossly normal   Breasts: Examined lying and sitting.     Right: Without masses, retractions, discharge or axillary adenopathy.     Left: Without masses, retractions, discharge or axillary adenopathy. Gentitourinary   Inguinal/mons:  Normal without inguinal adenopathy  External genitalia:  Normal  BUS/Urethra/Skene's glands:  Normal  Vagina:  Atrophic  Cervix:  Normal  Uterus:   normal in size, shape and contour.  Midline and mobile  Adnexa/parametria:     Rt: Without masses or  tenderness.   Lt: Without masses or tenderness.  Anus and perineum: Normal  Digital rectal exam: Normal sphincter tone without palpated masses or tenderness  Assessment/Plan:  60 y.o. S WF G0  for annual exam with vaginal dryness.Marland Kitchen.  Postmenopausal/no HRT/no bleeding Vaginal atrophy Osteoporosis on Fosamax HSV rare outbreaks 1984 LEEP cone normal Paps after History of recurrent UTIs  Plan: Macrodantin 50 mg by mouth after intercourse as needed.  Valtrex 500 twice daily for 3-5 days when necessary. Fosamax 70 mg by mouth weekly, prescription, proper use given and reviewed. Continue vitamin D 2000 daily. Home safety, fall prevention and importance of weightbearing exercise reviewed. Will repeat DEXA next year. Will schedule colonoscopy and Zostavax.  Continue vaginal lubricants with intercourse. CBC, CMP, lipid panel, vitamin D, UA, Pap normal  2016 new screening guidelines reviewed.    Harrington ChallengerYOUNG,NANCY J [facility]NP, 9:31 AM 05/21/2016

## 2016-05-23 ENCOUNTER — Encounter: Payer: PRIVATE HEALTH INSURANCE | Admitting: Women's Health

## 2016-06-23 ENCOUNTER — Telehealth: Payer: Self-pay | Admitting: *Deleted

## 2016-06-23 NOTE — Telephone Encounter (Signed)
Pt called asking if we could give her the shingles vaccination I explained to patient we can not give her vaccine.

## 2016-10-20 ENCOUNTER — Telehealth: Payer: Self-pay | Admitting: *Deleted

## 2016-10-20 DIAGNOSIS — N390 Urinary tract infection, site not specified: Secondary | ICD-10-CM

## 2016-10-20 DIAGNOSIS — B009 Herpesviral infection, unspecified: Secondary | ICD-10-CM

## 2016-10-20 MED ORDER — NITROFURANTOIN MACROCRYSTAL 50 MG PO CAPS: ORAL_CAPSULE | ORAL | 0 refills | Status: DC

## 2016-10-20 MED ORDER — VALACYCLOVIR HCL 500 MG PO TABS: 500.0000 mg | ORAL_TABLET | Freq: Two times a day (BID) | ORAL | 7 refills | Status: DC

## 2016-10-20 NOTE — Telephone Encounter (Signed)
Okay for both, Valtrex 500 by mouth twice a day 3-5 days as needed I think is the way she takes it. Macrodantin 50 mg after intercourse #30 no refills.

## 2016-10-20 NOTE — Telephone Encounter (Signed)
Rx sent 

## 2016-10-20 NOTE — Telephone Encounter (Signed)
Pt called requesting refill on macrodantin 50 mg capsule after intercourse and valtrex 500 mg tablet. I will send in Rx valtrex, okay to send macrodantin as well?

## 2016-10-22 ENCOUNTER — Encounter: Payer: Self-pay | Admitting: Gynecology

## 2017-01-23 ENCOUNTER — Telehealth: Payer: Self-pay | Admitting: *Deleted

## 2017-01-23 DIAGNOSIS — Z1329 Encounter for screening for other suspected endocrine disorder: Secondary | ICD-10-CM

## 2017-01-23 NOTE — Telephone Encounter (Signed)
You are back up md) pt received a notice about FDA recall on synthroid 50 mcg, pt asked if should continue Rx? What to do regarding this? Please advise

## 2017-01-23 NOTE — Telephone Encounter (Signed)
I would forward this to [place].

## 2017-01-25 NOTE — Telephone Encounter (Signed)
Please call and review overdue for labs, have her come for a TSH and will proceed after.  All synthroid 50? Or generic or brand?

## 2017-01-26 ENCOUNTER — Other Ambulatory Visit: Payer: PRIVATE HEALTH INSURANCE

## 2017-01-26 DIAGNOSIS — Z1329 Encounter for screening for other suspected endocrine disorder: Secondary | ICD-10-CM

## 2017-01-26 LAB — TSH: TSH: 4.68 mIU/L — ABNORMAL HIGH

## 2017-01-26 NOTE — Telephone Encounter (Signed)
Pt informed, though she had labs done in 05/2016, pt said she will check with insurance company and call back to schedule lab appointment.

## 2017-01-26 NOTE — Telephone Encounter (Signed)
Grace Wood patient never had labs done in 2017 coming today to have TSH only orders placed. Will check annual labs at 2018 annual visit.

## 2017-01-27 ENCOUNTER — Telehealth: Payer: Self-pay | Admitting: *Deleted

## 2017-01-27 MED ORDER — LEVOTHYROXINE SODIUM 75 MCG PO TABS: 75.0000 ug | ORAL_TABLET | Freq: Every day | ORAL | 4 refills | Status: DC

## 2017-01-27 NOTE — Telephone Encounter (Signed)
Pt aware Rx sent.  

## 2017-01-27 NOTE — Telephone Encounter (Signed)
Grace Wood this is patient that received a recall on levothyroxine, she wanted to know what to do? Have you heard anything about recall? Please advise

## 2017-01-27 NOTE — Telephone Encounter (Signed)
I am unaware of this Synthroid recall, okay to take Synthroid 75 g, prefer branded name only but sometimes it is cost prohibitive.

## 2017-01-27 NOTE — Telephone Encounter (Signed)
Yes - she does not need to take  Synthroid 50 g  - she needs to increase her dose to 75 g so please E scribe the new dose of 75 g of Synthroid. So by checking her TSH level rectify the problem of not needing the 50 g.  thanks

## 2017-01-27 NOTE — Telephone Encounter (Signed)
Pt is aware she needs to increase to 75 mcg, she called yesterday because she received at alert regarding synthroid FDA recall. You told patient " Please call and review overdue for labs, have her come for a TSH and will proceed after.  TSH has been repeated aware to increase to 75 mcg, her question  Is it safe to continue the synthroid with a FDA recall? Pt doesn't know what dose FDA has recalled, the alert said check with your doctor if you are taking generic synthroid regarding recall. Please advise

## 2017-01-27 NOTE — Telephone Encounter (Signed)
-----   Message from Harrington Challenger, NP sent at 01/27/2017  7:20 AM EDT ----- Please call and review TSH is elevated, which means she needs  increase in Synthroid dose, she is currently on 50 g will need to go up to a 75 g Synthroid. Please E scribe for her. Her annual is due in December we will recheck her TSH then

## 2017-03-13 ENCOUNTER — Telehealth: Payer: Self-pay | Admitting: *Deleted

## 2017-03-13 NOTE — Telephone Encounter (Signed)
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 I told pt Harriett Sine is off on Thursday and fridays and she can bring form to office to fill out, but to make sure she makes note on what to do with form once signed.

## 2017-04-29 ENCOUNTER — Encounter: Payer: Self-pay | Admitting: Women's Health

## 2017-04-29 ENCOUNTER — Ambulatory Visit (INDEPENDENT_AMBULATORY_CARE_PROVIDER_SITE_OTHER): Payer: PRIVATE HEALTH INSURANCE | Admitting: Women's Health

## 2017-04-29 VITALS — BP 120/80

## 2017-04-29 DIAGNOSIS — N3001 Acute cystitis with hematuria: Secondary | ICD-10-CM

## 2017-04-29 DIAGNOSIS — R350 Frequency of micturition: Secondary | ICD-10-CM | POA: Diagnosis not present

## 2017-04-29 MED ORDER — NITROFURANTOIN MONOHYD MACRO 100 MG PO CAPS: 100.0000 mg | ORAL_CAPSULE | Freq: Two times a day (BID) | ORAL | 0 refills | Status: AC

## 2017-04-29 NOTE — Patient Instructions (Signed)

## 2017-04-29 NOTE — Progress Notes (Signed)
61 year old S WF G0 presents with complaint of visible blood in urine, states clear today, minimal symptoms other than frequency. History of recurrent UTIs resistant to Cipro, allergy to Septra. Took 2 Macrodantin 50 mg after noting blood in urine, which she uses as needed with intercourse. Hypothyroid, osteoporosis on Fosamax tolerating well, HSV rare outbreaks. Not sexually active. Postmenopausal on no HRT with no bleeding.  Exam: Appears well. External genitalia within normal limits, speculum exam no visible blood or discharge noted. No CVAT. UA: +2 blood, +1 leukocytes, 20-40 WBCs, 20-40 RBCs, 6-10 squamous epithelials, moderate bacteria  UTI with hematuria  Plan: Macrobid twice daily for 7 days, take with food, test of cure UA in 3 weeks. Instructed to call if no relief of symptoms. UTI prevention discussed. Urine culture pending.

## 2017-05-01 LAB — URINALYSIS W MICROSCOPIC + REFLEX CULTURE
Bilirubin Urine: NEGATIVE
GLUCOSE, UA: NEGATIVE
Hyaline Cast: NONE SEEN /LPF
Ketones, ur: NEGATIVE
NITRITES URINE, INITIAL: NEGATIVE
Protein, ur: NEGATIVE
SPECIFIC GRAVITY, URINE: 1.02 (ref 1.001–1.03)
pH: 6.5 (ref 5.0–8.0)

## 2017-05-01 LAB — URINE CULTURE
MICRO NUMBER: 81319711
SPECIMEN QUALITY: ADEQUATE

## 2017-05-01 LAB — CULTURE INDICATED

## 2017-06-06 ENCOUNTER — Other Ambulatory Visit: Payer: Self-pay | Admitting: Women's Health

## 2017-06-06 DIAGNOSIS — M810 Age-related osteoporosis without current pathological fracture: Secondary | ICD-10-CM

## 2017-06-08 ENCOUNTER — Encounter: Payer: Self-pay | Admitting: Women's Health

## 2017-06-16 ENCOUNTER — Ambulatory Visit (INDEPENDENT_AMBULATORY_CARE_PROVIDER_SITE_OTHER): Payer: PRIVATE HEALTH INSURANCE | Admitting: Women's Health

## 2017-06-16 ENCOUNTER — Encounter: Payer: Self-pay | Admitting: Women's Health

## 2017-06-16 VITALS — BP 110/78 | Ht 61.0 in | Wt 123.0 lb

## 2017-06-16 DIAGNOSIS — M810 Age-related osteoporosis without current pathological fracture: Secondary | ICD-10-CM

## 2017-06-16 DIAGNOSIS — Z01419 Encounter for gynecological examination (general) (routine) without abnormal findings: Secondary | ICD-10-CM | POA: Diagnosis not present

## 2017-06-16 DIAGNOSIS — B009 Herpesviral infection, unspecified: Secondary | ICD-10-CM

## 2017-06-16 DIAGNOSIS — E038 Other specified hypothyroidism: Secondary | ICD-10-CM

## 2017-06-16 LAB — COMPREHENSIVE METABOLIC PANEL
AG Ratio: 1.6 (calc) (ref 1.0–2.5)
ALBUMIN MSPROF: 4.2 g/dL (ref 3.6–5.1)
ALT: 31 U/L — ABNORMAL HIGH (ref 6–29)
AST: 31 U/L (ref 10–35)
Alkaline phosphatase (APISO): 67 U/L (ref 33–130)
BUN: 14 mg/dL (ref 7–25)
CO2: 27 mmol/L (ref 20–32)
CREATININE: 0.81 mg/dL (ref 0.50–0.99)
Calcium: 9.1 mg/dL (ref 8.6–10.4)
Chloride: 102 mmol/L (ref 98–110)
GLUCOSE: 95 mg/dL (ref 65–99)
Globulin: 2.7 g/dL (calc) (ref 1.9–3.7)
POTASSIUM: 4 mmol/L (ref 3.5–5.3)
SODIUM: 137 mmol/L (ref 135–146)
TOTAL PROTEIN: 6.9 g/dL (ref 6.1–8.1)
Total Bilirubin: 0.8 mg/dL (ref 0.2–1.2)

## 2017-06-16 LAB — TSH: TSH: 0.1 m[IU]/L — AB (ref 0.40–4.50)

## 2017-06-16 MED ORDER — LEVOTHYROXINE SODIUM 75 MCG PO TABS: 75.0000 ug | ORAL_TABLET | Freq: Every day | ORAL | 4 refills | Status: DC

## 2017-06-16 MED ORDER — ALENDRONATE SODIUM 70 MG PO TABS: ORAL_TABLET | ORAL | 11 refills | Status: DC

## 2017-06-16 MED ORDER — VALACYCLOVIR HCL 500 MG PO TABS
500.0000 mg | ORAL_TABLET | Freq: Two times a day (BID) | ORAL | 7 refills | Status: DC
Start: 2017-06-16 — End: 2017-11-25

## 2017-06-16 NOTE — Patient Instructions (Signed)
Health Maintenance for Postmenopausal Women Menopause is a normal process in which your reproductive ability comes to an end. This process happens gradually over a span of months to years, usually between the ages of 22 and 9. Menopause is complete when you have missed 12 consecutive menstrual periods. It is important to talk with your health care provider about some of the most common conditions that affect postmenopausal women, such as heart disease, cancer, and bone loss (osteoporosis). Adopting a healthy lifestyle and getting preventive care can help to promote your health and wellness. Those actions can also lower your chances of developing some of these common conditions. What should I know about menopause? During menopause, you may experience a number of symptoms, such as:  Moderate-to-severe hot flashes.  Night sweats.  Decrease in sex drive.  Mood swings.  Headaches.  Tiredness.  Irritability.  Memory problems.  Insomnia.  Choosing to treat or not to treat menopausal changes is an individual decision that you make with your health care provider. What should I know about hormone replacement therapy and supplements? Hormone therapy products are effective for treating symptoms that are associated with menopause, such as hot flashes and night sweats. Hormone replacement carries certain risks, especially as you become older. If you are thinking about using estrogen or estrogen with progestin treatments, discuss the benefits and risks with your health care provider. What should I know about heart disease and stroke? Heart disease, heart attack, and stroke become more likely as you age. This may be due, in part, to the hormonal changes that your body experiences during menopause. These can affect how your body processes dietary fats, triglycerides, and cholesterol. Heart attack and stroke are both medical emergencies. There are many things that you can do to help prevent heart disease  and stroke:  Have your blood pressure checked at least every 1-2 years. High blood pressure causes heart disease and increases the risk of stroke.  If you are 53-22 years old, ask your health care provider if you should take aspirin to prevent a heart attack or a stroke.  Do not use any tobacco products, including cigarettes, chewing tobacco, or electronic cigarettes. If you need help quitting, ask your health care provider.  It is important to eat a healthy diet and maintain a healthy weight. ? Be sure to include plenty of vegetables, fruits, low-fat dairy products, and lean protein. ? Avoid eating foods that are high in solid fats, added sugars, or salt (sodium).  Get regular exercise. This is one of the most important things that you can do for your health. ? Try to exercise for at least 150 minutes each week. The type of exercise that you do should increase your heart rate and make you sweat. This is known as moderate-intensity exercise. ? Try to do strengthening exercises at least twice each week. Do these in addition to the moderate-intensity exercise.  Know your numbers.Ask your health care provider to check your cholesterol and your blood glucose. Continue to have your blood tested as directed by your health care provider.  What should I know about cancer screening? There are several types of cancer. Take the following steps to reduce your risk and to catch any cancer development as early as possible. Breast Cancer  Practice breast self-awareness. ? This means understanding how your breasts normally appear and feel. ? It also means doing regular breast self-exams. Let your health care provider know about any changes, no matter how small.  If you are 40  or older, have a clinician do a breast exam (clinical breast exam or CBE) every year. Depending on your age, family history, and medical history, it may be recommended that you also have a yearly breast X-ray (mammogram).  If you  have a family history of breast cancer, talk with your health care provider about genetic screening.  If you are at high risk for breast cancer, talk with your health care provider about having an MRI and a mammogram every year.  Breast cancer (BRCA) gene test is recommended for women who have family members with BRCA-related cancers. Results of the assessment will determine the need for genetic counseling and BRCA1 and for BRCA2 testing. BRCA-related cancers include these types: ? Breast. This occurs in males or females. ? Ovarian. ? Tubal. This may also be called fallopian tube cancer. ? Cancer of the abdominal or pelvic lining (peritoneal cancer). ? Prostate. ? Pancreatic.  Cervical, Uterine, and Ovarian Cancer Your health care provider may recommend that you be screened regularly for cancer of the pelvic organs. These include your ovaries, uterus, and vagina. This screening involves a pelvic exam, which includes checking for microscopic changes to the surface of your cervix (Pap test).  For women ages 21-65, health care providers may recommend a pelvic exam and a Pap test every three years. For women ages 79-65, they may recommend the Pap test and pelvic exam, combined with testing for human papilloma virus (HPV), every five years. Some types of HPV increase your risk of cervical cancer. Testing for HPV may also be done on women of any age who have unclear Pap test results.  Other health care providers may not recommend any screening for nonpregnant women who are considered low risk for pelvic cancer and have no symptoms. Ask your health care provider if a screening pelvic exam is right for you.  If you have had past treatment for cervical cancer or a condition that could lead to cancer, you need Pap tests and screening for cancer for at least 20 years after your treatment. If Pap tests have been discontinued for you, your risk factors (such as having a new sexual partner) need to be  reassessed to determine if you should start having screenings again. Some women have medical problems that increase the chance of getting cervical cancer. In these cases, your health care provider may recommend that you have screening and Pap tests more often.  If you have a family history of uterine cancer or ovarian cancer, talk with your health care provider about genetic screening.  If you have vaginal bleeding after reaching menopause, tell your health care provider.  There are currently no reliable tests available to screen for ovarian cancer.  Lung Cancer Lung cancer screening is recommended for adults 69-62 years old who are at high risk for lung cancer because of a history of smoking. A yearly low-dose CT scan of the lungs is recommended if you:  Currently smoke.  Have a history of at least 30 pack-years of smoking and you currently smoke or have quit within the past 15 years. A pack-year is smoking an average of one pack of cigarettes per day for one year.  Yearly screening should:  Continue until it has been 15 years since you quit.  Stop if you develop a health problem that would prevent you from having lung cancer treatment.  Colorectal Cancer  This type of cancer can be detected and can often be prevented.  Routine colorectal cancer screening usually begins at  age 42 and continues through age 45.  If you have risk factors for colon cancer, your health care provider may recommend that you be screened at an earlier age.  If you have a family history of colorectal cancer, talk with your health care provider about genetic screening.  Your health care provider may also recommend using home test kits to check for hidden blood in your stool.  A small camera at the end of a tube can be used to examine your colon directly (sigmoidoscopy or colonoscopy). This is done to check for the earliest forms of colorectal cancer.  Direct examination of the colon should be repeated every  5-10 years until age 71. However, if early forms of precancerous polyps or small growths are found or if you have a family history or genetic risk for colorectal cancer, you may need to be screened more often.  Skin Cancer  Check your skin from head to toe regularly.  Monitor any moles. Be sure to tell your health care provider: ? About any new moles or changes in moles, especially if there is a change in a mole's shape or color. ? If you have a mole that is larger than the size of a pencil eraser.  If any of your family members has a history of skin cancer, especially at a Jarred Purtee age, talk with your health care provider about genetic screening.  Always use sunscreen. Apply sunscreen liberally and repeatedly throughout the day.  Whenever you are outside, protect yourself by wearing long sleeves, pants, a wide-brimmed hat, and sunglasses.  What should I know about osteoporosis? Osteoporosis is a condition in which bone destruction happens more quickly than new bone creation. After menopause, you may be at an increased risk for osteoporosis. To help prevent osteoporosis or the bone fractures that can happen because of osteoporosis, the following is recommended:  If you are 46-71 years old, get at least 1,000 mg of calcium and at least 600 mg of vitamin D per day.  If you are older than age 55 but younger than age 65, get at least 1,200 mg of calcium and at least 600 mg of vitamin D per day.  If you are older than age 54, get at least 1,200 mg of calcium and at least 800 mg of vitamin D per day.  Smoking and excessive alcohol intake increase the risk of osteoporosis. Eat foods that are rich in calcium and vitamin D, and do weight-bearing exercises several times each week as directed by your health care provider. What should I know about how menopause affects my mental health? Depression may occur at any age, but it is more common as you become older. Common symptoms of depression  include:  Low or sad mood.  Changes in sleep patterns.  Changes in appetite or eating patterns.  Feeling an overall lack of motivation or enjoyment of activities that you previously enjoyed.  Frequent crying spells.  Talk with your health care provider if you think that you are experiencing depression. What should I know about immunizations? It is important that you get and maintain your immunizations. These include:  Tetanus, diphtheria, and pertussis (Tdap) booster vaccine.  Influenza every year before the flu season begins.  Pneumonia vaccine.  Shingles vaccine.  Your health care provider may also recommend other immunizations. This information is not intended to replace advice given to you by your health care provider. Make sure you discuss any questions you have with your health care provider. Document Released: 07/18/2005  Document Revised: 12/14/2015 Document Reviewed: 02/27/2015 Elsevier Interactive Patient Education  2018 [employer] Inc[employer]  

## 2017-06-16 NOTE — Progress Notes (Signed)
Julian ReilJudy A Dolinski July 07, 1955 [id number]    History:    Presents for annual exam. Postmenopausal on no HRT with no bleeding. 1984 LEEP with normal Paps after. Normal  mammogram history. Osteoporosis. Was on Prolia from one year, insurance issues stopped,  currently on Fosamax one year tolerating well with improvement of DEXA 12/ 2018. History of premature menopause on no HRT. Hypothyroid. 2018 negative colonoscopy. Has had Shingrex.  Past medical history, past surgical history, family history and social history were all reviewed and documented in the EPIC chart. 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  ROS:  A ROS was performed and pertinent positives and negatives are included.  Exam:  Vitals:   06/16/17 1121  BP: 110/78  Weight: 123 lb (55.8 kg)  Height: 5\' 1"  (1.549 m)   Body mass index is 23.24 kg/m.   General appearance:  Normal Thyroid:  Symmetrical, normal in size, without palpable masses or nodularity. Respiratory  Auscultation:  Clear without wheezing or rhonchi Cardiovascular  Auscultation:  Regular rate, without rubs, murmurs or gallops  Edema/varicosities:  Not grossly evident Abdominal  Soft,nontender, without masses, guarding or rebound.  Liver/spleen:  No organomegaly noted  Hernia:  None appreciated  Skin  Inspection:  Grossly normal   Breasts: Examined lying and sitting.     Right: Without masses, retractions, discharge or axillary adenopathy.     Left: Without masses, retractions, discharge or axillary adenopathy. Gentitourinary   Inguinal/mons:  Normal without inguinal adenopathy  External genitalia:  Normal  BUS/Urethra/Skene's glands:  Normal  Vagina:  Normal  Cervix:  Normal  Uterus:   normal in size, shape and contour.  Midline and mobile  Adnexa/parametria:     Rt: Without masses or tenderness.   Lt: Without masses or tenderness.  Anus and perineum: Normal  Digital rectal  exam: Normal sphincter tone without palpated masses or tenderness  Assessment/Plan:  62 y.o. S WF G0 for annual exam with no complaints.  Postmenopausal/no HRT/no bleeding Hypothyroid on Synthroid 75 g Osteoporosis without fracture on Fosamax HSV rare outbreaks Macrodantin prophylactically with intercourse no UTIs  Plan: Valtrex 500 twice daily for 3-5 days when necessary. Synthroid 75 g by mouth daily proper administration reviewed. DEXA reviewed, has shown improvement will continue on Fosamax 70 mg weekly prescription, proper use given and reviewed. Reviewed importance of weightbearing exercise, balance type exercise  encouraged. Home safety, fall prevention discussed. Macrodantin 50 mg when necessary with intercourse prescription, proper use given and reviewed. SBE's, continue annual screening mammogram, overdue instructed to schedule. Calcium rich diet, continue vitamin D 2000 daily encouraged. CMP, TSH. Requested minimum due to insurance issues.   Harrington Challengerancy J Young [facility] HospitalWHNP, 2:44 PM 06/16/2017

## 2017-06-18 ENCOUNTER — Other Ambulatory Visit: Payer: Self-pay | Admitting: Women's Health

## 2017-06-18 DIAGNOSIS — R7989 Other specified abnormal findings of blood chemistry: Secondary | ICD-10-CM

## 2017-07-08 ENCOUNTER — Other Ambulatory Visit: Payer: Self-pay | Admitting: Women's Health

## 2017-07-08 ENCOUNTER — Telehealth: Payer: Self-pay

## 2017-07-08 MED ORDER — LEVOTHYROXINE SODIUM 75 MCG PO TABS: 75.0000 ug | ORAL_TABLET | Freq: Every day | ORAL | 4 refills | Status: DC

## 2017-07-08 MED ORDER — LEVOTHYROXINE SODIUM 50 MCG PO TABS
50.0000 ug | ORAL_TABLET | Freq: Every day | ORAL | 1 refills | Status: DC
Start: 2017-07-08 — End: 2017-09-08

## 2017-07-08 NOTE — Telephone Encounter (Signed)
Patient called to ask me if I would [facility] as it will be less expensive for her. Rx sent.

## 2017-07-27 ENCOUNTER — Encounter (HOSPITAL_BASED_OUTPATIENT_CLINIC_OR_DEPARTMENT_OTHER): Payer: Self-pay | Admitting: Emergency Medicine

## 2017-07-27 ENCOUNTER — Other Ambulatory Visit: Payer: Self-pay

## 2017-07-27 DIAGNOSIS — R0789 Other chest pain: Secondary | ICD-10-CM | POA: Insufficient documentation

## 2017-07-27 DIAGNOSIS — E039 Hypothyroidism, unspecified: Secondary | ICD-10-CM | POA: Insufficient documentation

## 2017-07-27 DIAGNOSIS — Z87891 Personal history of nicotine dependence: Secondary | ICD-10-CM | POA: Insufficient documentation

## 2017-07-27 DIAGNOSIS — Z79899 Other long term (current) drug therapy: Secondary | ICD-10-CM | POA: Insufficient documentation

## 2017-07-27 NOTE — ED Triage Notes (Signed)
Pt presents with c/o chest pain that started after eating dark chocolate pretzel and salad.

## 2017-07-28 ENCOUNTER — Emergency Department (HOSPITAL_BASED_OUTPATIENT_CLINIC_OR_DEPARTMENT_OTHER): Payer: PRIVATE HEALTH INSURANCE

## 2017-07-28 ENCOUNTER — Emergency Department (HOSPITAL_BASED_OUTPATIENT_CLINIC_OR_DEPARTMENT_OTHER)
Admission: EM | Admit: 2017-07-28 | Discharge: 2017-07-28 | Disposition: A | Discharge status: Home / Self Care | Payer: PRIVATE HEALTH INSURANCE | Attending: Emergency Medicine | Admitting: Emergency Medicine

## 2017-07-28 DIAGNOSIS — R0789 Other chest pain: Secondary | ICD-10-CM

## 2017-07-28 LAB — CBC
HCT: 33.8 % — ABNORMAL LOW (ref 36.0–46.0)
Hemoglobin: 11 g/dL — ABNORMAL LOW (ref 12.0–15.0)
MCH: 29.6 pg (ref 26.0–34.0)
MCHC: 32.5 g/dL (ref 30.0–36.0)
MCV: 90.9 fL (ref 78.0–100.0)
PLATELETS: 226 10*3/uL (ref 150–400)
RBC: 3.72 MIL/uL — AB (ref 3.87–5.11)
RDW: 12.7 % (ref 11.5–15.5)
WBC: 7.7 10*3/uL (ref 4.0–10.5)

## 2017-07-28 LAB — TROPONIN I

## 2017-07-28 LAB — BASIC METABOLIC PANEL
Anion gap: 9 (ref 5–15)
BUN: 15 mg/dL (ref 6–20)
CALCIUM: 8.6 mg/dL — AB (ref 8.9–10.3)
CO2: 26 mmol/L (ref 22–32)
CREATININE: 0.77 mg/dL (ref 0.44–1.00)
Chloride: 102 mmol/L (ref 101–111)
GFR calc non Af Amer: >60 mL/min (ref >60)
Glucose, Bld: 98 mg/dL (ref 65–99)
Potassium: 3.9 mmol/L (ref 3.5–5.1)
SODIUM: 137 mmol/L (ref 135–145)

## 2017-07-28 LAB — LIPASE, BLOOD: Lipase: 83 U/L — ABNORMAL HIGH (ref 11–51)

## 2017-07-28 MED ORDER — OMEPRAZOLE 20 MG PO CPDR: 20.0000 mg | DELAYED_RELEASE_CAPSULE | Freq: Every day | ORAL | 0 refills | Status: AC

## 2017-07-28 MED ORDER — GI COCKTAIL ~~LOC~~
30.0000 mL | Freq: Once | ORAL | Status: AC
  Administered 2017-07-28: 30 mL via ORAL
  Filled 2017-07-28: qty 30

## 2017-07-28 NOTE — Discharge Instructions (Signed)
You were seen today for chest pain.  Your chest pain workup is reassuring.  Given relation to food and improvement with GI cocktail, there may be an element of reflux.  Start omeprazole daily to see if this helps.  If you have any new or worsening symptoms you should be reevaluated.  Cardiology follow-up provided above.

## 2017-07-28 NOTE — ED Notes (Signed)
Pt verbalizes understanding of dc instructions and denies any further needs at his time 

## 2017-07-28 NOTE — ED Notes (Signed)
Pt verbalizes some pain relief after GI cocktail.

## 2017-07-28 NOTE — ED Provider Notes (Signed)
[facility] EMERGENCY DEPARTMENT Provider Note   CSN: [id number] Arrival date & time: 07/27/17  2317     History   Chief Complaint Chief Complaint  Patient presents with  . Chest Pain    HPI Grace Wood is a 62 y.o. female.  HPI  This is a 62 year old female who presents with chest pain.  Patient reports onset of chest discomfort around 5 PM after eating a salad and a pretzel.  She reports that it is pressure-like and nonradiating.  Denies shortness of breath.  Denies recent cough or fevers.  Pain has been constant.  Not associated with exertion.  Denies any history of heart disease, diabetes, hypertension, hyperlipidemia, early family history of heart disease.  Currently she rates her pain at 6 out of 10.  Past Medical History:  Diagnosis Date  . Hematuria    NEGATIVE WORK UP DR Marcello Fennel  . Hypothyroid 2010  . Mitral valve prolapse   . Premature menopause    AGE 43  . Senile osteoporosis 02/2009   -2.6    Patient Active Problem List   Diagnosis Date Noted  . Osteoporosis 04/30/2012  . Mitral valve prolapse   . DEPRESSION 06/22/2007  . MITRAL REGURGITATION 06/22/2007  . UTI'S, RECURRENT 06/22/2007  . 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 BOILS, RECURRENT 06/22/2007    Past Surgical History:  Procedure Laterality Date  . CERVICAL CONE BIOPSY  1984  . DILATION AND CURETTAGE OF UTERUS    . HYPEREXTENSION OF BLADDER  85,95    OB History    Gravida Para Term Preterm AB Living   0         0   SAB TAB Ectopic Multiple Live Births                   Home Medications    Prior to Admission medications   Medication Sig Start Date End Date Taking? Authorizing Provider  alendronate (FOSAMAX) 70 MG tablet TAKE 1 TABLET BY MOUTH EVERY 7 DAYS WITHA FULL GLASS OF WATER ON AN EMPTY STOMACH 06/16/17   Harrington Challenger, NP  Ascorbic Acid (VITAMIN C) 1000 MG tablet Take 1,000 mg by mouth daily.    [provider]  calcium carbonate (CALCIUM 600)  600 MG TABS tablet Take 600 mg by mouth daily.    [provider]  Cholecalciferol (VITAMIN D) 2000 units CAPS Take 1 capsule by mouth daily.    [provider]  ferrous sulfate 325 (65 FE) MG tablet Take 325 mg by mouth daily.    [provider]  fexofenadine (ALLEGRA) 180 MG tablet Take 180 mg by mouth daily.    [provider]  ipratropium (ATROVENT) 0.06 % nasal spray Place 2 sprays into the nose 3 (three) times daily. 11/07/11 05/17/15  Sondra Barges, PA-C  levothyroxine (SYNTHROID, LEVOTHROID) 50 MCG tablet Take 1 tablet (50 mcg total) by mouth daily. 07/08/17   Harrington Challenger, NP  nitrofurantoin (MACRODANTIN) 50 MG capsule Take after intercourse as needed 10/20/16   Harrington Challenger, NP  omeprazole (PRILOSEC) 20 MG capsule Take 1 capsule (20 mg total) by mouth daily. 07/28/17   Sierria Bruney, Mayer Masker, MD  valACYclovir (VALTREX) 500 MG tablet Take 1 tablet (500 mg total) by mouth 2 (two) times daily. 06/16/17   Harrington Challenger, NP    Family History Family History  Problem Relation Age of Onset  . Cancer Mother        HODGKINS  .  Heart disease Mother   . Hypertension Brother   . Heart disease Paternal Aunt   . Breast cancer Paternal Aunt   . Heart disease Maternal Grandmother     Social History Social History   Tobacco Use  . Smoking status: Former Games developermoker  . Smokeless tobacco: Never Used  . Tobacco comment: Quit 2007  Substance Use Topics  . Alcohol use: No  . Drug use: No     Allergies   Hydrocodone-acetaminophen; Propoxyphene n-acetaminophen; Septra [bactrim]; and Sulfonamide derivatives   Review of Systems Review of Systems  Constitutional: Negative for fever.  Respiratory: Negative for cough and shortness of breath.   Cardiovascular: Positive for chest pain.  Gastrointestinal: Negative for abdominal pain, nausea and vomiting.  Genitourinary: Negative for dysuria.  All other systems reviewed and are negative.    Physical Exam Updated  Vital Signs BP 135/88 (BP Location: Right Arm)   Pulse 72   Temp 98.7 F (37.1 C) (Oral)   Resp 18   LMP 04/28/1997   SpO2 100%   Physical Exam  Constitutional: She is oriented to person, place, and time. She appears well-developed and well-nourished.  HENT:  Head: Normocephalic and atraumatic.  Cardiovascular: Normal rate, regular rhythm and normal pulses.  Murmur heard. Some anterior chest wall tenderness to palpation without crepitus  Pulmonary/Chest: Effort normal. No respiratory distress. She has no wheezes.  Abdominal: Soft. Bowel sounds are normal. There is no tenderness.  Neurological: She is alert and oriented to person, place, and time.  Skin: Skin is warm and dry.  Psychiatric: She has a normal mood and affect.  Nursing note and vitals reviewed.    ED Treatments / Results  Labs (all labs ordered are listed, but only abnormal results are displayed) Labs Reviewed  BASIC METABOLIC PANEL - Abnormal; Notable for the following components:      Result Value   Calcium 8.6 (*)    All other components within normal limits  CBC - Abnormal; Notable for the following components:   RBC 3.72 (*)    Hemoglobin 11.0 (*)    HCT 33.8 (*)    All other components within normal limits  LIPASE, BLOOD - Abnormal; Notable for the following components:   Lipase 83 (*)    All other components within normal limits  TROPONIN I    EKG  EKG Interpretation  Date/Time:  Monday July 27 2017 23:34:52 EST Ventricular Rate:  70 PR Interval:  158 QRS Duration: 72 QT Interval:  396 QTC Calculation: 427 R Axis:   43 Text Interpretation:  Normal sinus rhythm Low voltage QRS Cannot rule out Anterior infarct , age undetermined Abnormal ECG No prior for comparison Confirmed by Ross MarcusHorton, Kamaryn Grimley ([id number]) on 07/28/2017 1:58:30 AM       Radiology Dg Chest 2 View  Result Date: 07/28/2017 CLINICAL DATA:  Chest pain EXAM: CHEST  2 VIEW COMPARISON:  MRI 08/13/2006 FINDINGS: Unchanged  appearance of round structure at the right cardio phrenic angle, compared to the prior MRI, 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 Both lungs are clear. The visualized skeletal structures are unremarkable. IMPRESSION: No active cardiopulmonary disease. Electronically Signed   By: Deatra RobinsonKevin  Herman M.D.   On: 07/28/2017 01:48    Procedures Procedures (including critical care time)  Medications Ordered in ED Medications  gi cocktail (Maalox,Lidocaine,Donnatal) (30 mLs Oral Given 07/28/17 0206)     Initial Impression / Assessment and Plan / ED Course  I have reviewed the triage vital signs and the nursing notes.  Pertinent  labs & imaging results that were available during my care of the patient were reviewed by me and considered in my medical decision making (see chart for details).     Patient presents with chest pain.  Ongoing since eating at night.  Vital signs reassuring.  Patient is nontoxic.  Afebrile.  Very atypical for ACS.  She is low risk with a heart score of 1.  EKG is nonischemic.  Initial troponin is negative.  Feel this is adequate given duration of symptoms.  Given onset of symptoms after eating, suspicious for gastritis or reflux.  Patient was given a GI cocktail.  Lab workup is largely reassuring.  Lipase is minimally elevated but patient denies any history of alcohol abuse or prior pancreatitis.  Doubt pancreatitis.  On recheck, she states that she is improved after the GI cocktail.  Recommend omeprazole initiation.  Follow-up with PCP.  She was also given referral to cardiology given her age if she has ongoing issues.  After history, exam, and medical workup I feel the patient has been appropriately medically screened and is safe for discharge home. Pertinent diagnoses were discussed with the patient. Patient was given return precautions.   Final Clinical Impressions(s) / ED Diagnoses   Final diagnoses:  Atypical chest pain    ED Discharge Orders        Ordered    omeprazole  (PRILOSEC) 20 MG capsule  Daily     07/28/17 0258       Shon Baton, MD 07/28/17 0302

## 2017-07-28 NOTE — ED Notes (Signed)
ED Provider at bedside. 

## 2017-07-28 NOTE — ED Notes (Signed)
Patient transported to X-ray 

## 2017-07-28 NOTE — ED Notes (Signed)
Pt c/o central chest pain that radiates down towards epigastric area, the pain started after eating.  When it did not get better on its own, pt went to work where she had some french fries.  Her pain is still there.

## 2017-08-10 ENCOUNTER — Telehealth: Payer: Self-pay

## 2017-08-10 NOTE — Telephone Encounter (Signed)
Re: Levothyroxin 50 mcg tab.  Note to Prescriber: "The Brand patient has been on is no longer available. We need to change brand to dispense Rx. Please note. Thank you."

## 2017-08-10 NOTE — Telephone Encounter (Signed)
Pharmacy informed.

## 2017-08-10 NOTE — Telephone Encounter (Signed)
Ok for brand change 

## 2017-09-08 ENCOUNTER — Other Ambulatory Visit: Payer: Self-pay | Admitting: Women's Health

## 2017-09-08 ENCOUNTER — Other Ambulatory Visit: Payer: Self-pay

## 2017-09-08 MED ORDER — LEVOTHYROXINE SODIUM 50 MCG PO TABS: 50.0000 ug | ORAL_TABLET | Freq: Every day | ORAL | 0 refills | Status: DC

## 2017-09-08 NOTE — Telephone Encounter (Signed)
Patient called because no refills left on her Levothyroxine. I explained to her that when we discussed her [place] in Jan that [place] recommended the 50 mcg for 2 mos and then recheck labs to make sure dose is correct for her.  She can come next week for lab but does not have enough medication left for this week. Scheduled lab appointment for next Tuesday and one refill sent for her.

## 2017-09-15 ENCOUNTER — Other Ambulatory Visit: Payer: Self-pay

## 2017-09-16 ENCOUNTER — Other Ambulatory Visit: Payer: PRIVATE HEALTH INSURANCE

## 2017-09-16 DIAGNOSIS — R7989 Other specified abnormal findings of blood chemistry: Secondary | ICD-10-CM

## 2017-09-16 LAB — T4, FREE: FREE T4: 1.3 ng/dL (ref 0.8–1.8)

## 2017-09-16 LAB — TSH: TSH: 2.73 m[IU]/L (ref 0.40–4.50)

## 2017-10-06 ENCOUNTER — Other Ambulatory Visit: Payer: Self-pay | Admitting: Women's Health

## 2017-11-25 ENCOUNTER — Other Ambulatory Visit: Payer: Self-pay | Admitting: Women's Health

## 2017-11-25 DIAGNOSIS — B009 Herpesviral infection, unspecified: Secondary | ICD-10-CM

## 2017-11-25 DIAGNOSIS — N390 Urinary tract infection, site not specified: Secondary | ICD-10-CM

## 2017-11-25 DIAGNOSIS — M810 Age-related osteoporosis without current pathological fracture: Secondary | ICD-10-CM

## 2017-11-25 MED ORDER — ALENDRONATE SODIUM 70 MG PO TABS: ORAL_TABLET | ORAL | 11 refills | Status: DC

## 2017-11-25 MED ORDER — VALACYCLOVIR HCL 500 MG PO TABS: 500.0000 mg | ORAL_TABLET | Freq: Two times a day (BID) | ORAL | 7 refills | Status: DC

## 2017-11-25 MED ORDER — LEVOTHYROXINE SODIUM 50 MCG PO TABS: 50.0000 ug | ORAL_TABLET | Freq: Every day | ORAL | 11 refills | Status: DC

## 2017-11-25 MED ORDER — NITROFURANTOIN MACROCRYSTAL 50 MG PO CAPS: ORAL_CAPSULE | ORAL | 0 refills | Status: DC

## 2017-12-04 ENCOUNTER — Telehealth: Payer: Self-pay | Admitting: *Deleted

## 2017-12-04 MED ORDER — LEVOTHYROXINE SODIUM 50 MCG PO TABS: 50.0000 ug | ORAL_TABLET | Freq: Every day | ORAL | 1 refills | Status: DC

## 2017-12-04 NOTE — Telephone Encounter (Addendum)
Patient called [facility] [place]. Patient has [place].

## 2018-04-02 IMAGING — CR DG CHEST 2V
2 series · 2 of 2 positions shown · non-contrast
Comparison: MRI 08/13/2006

CLINICAL DATA: Chest pain

EXAM:
CHEST  2 VIEW

[w chest pa]
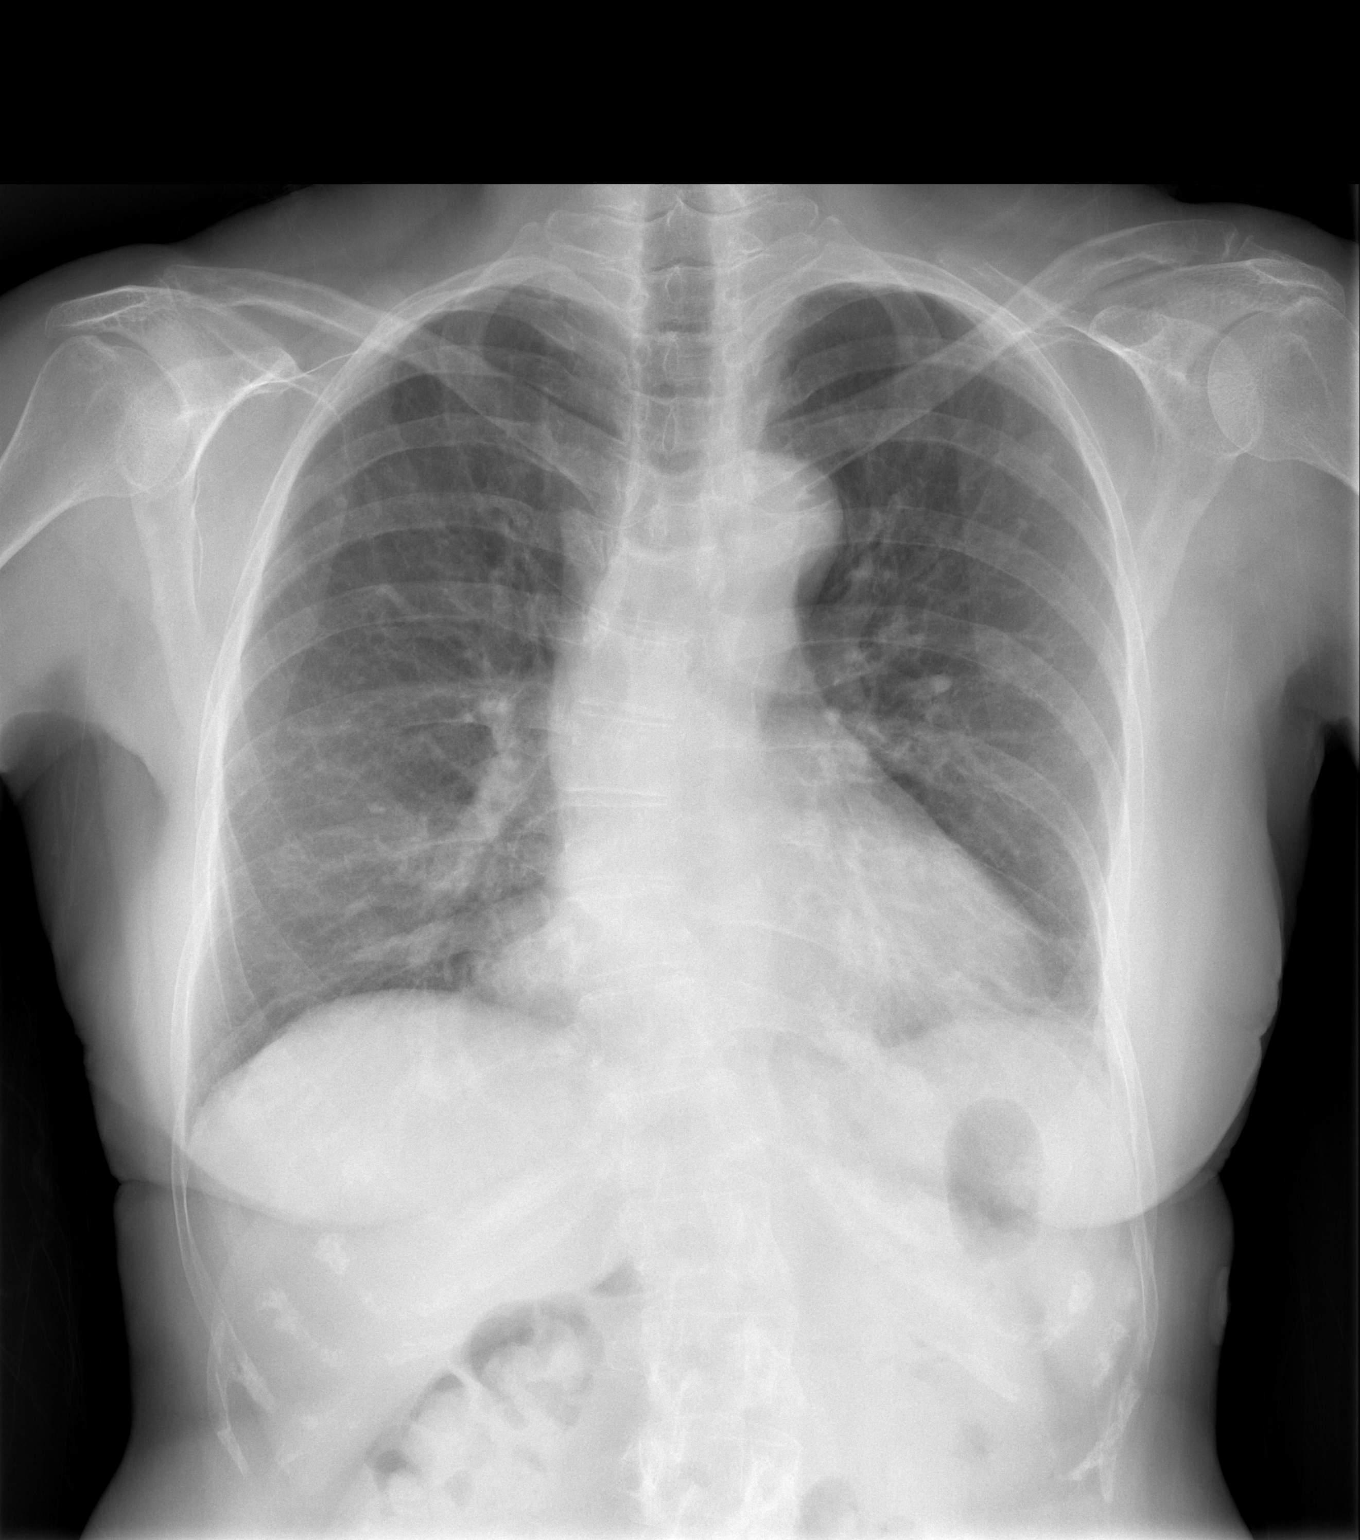

[w chest lat]
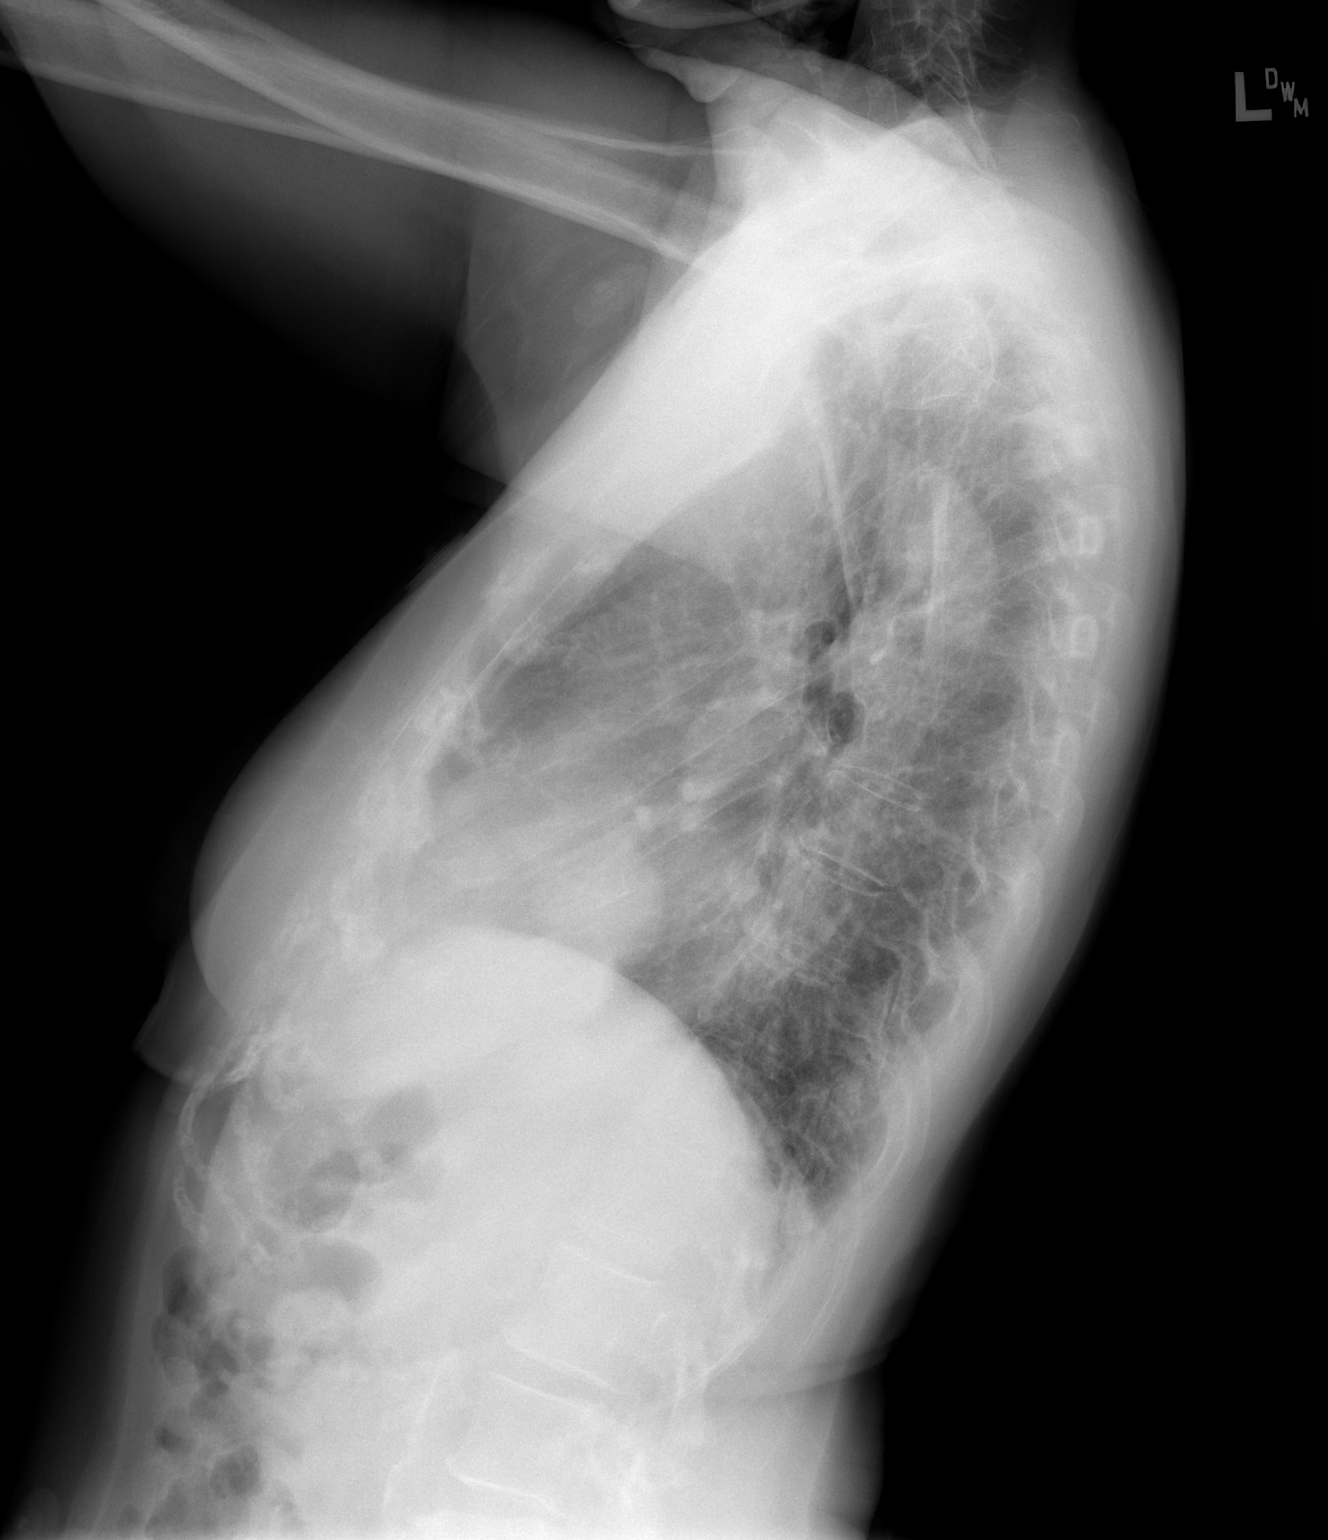

[2 of 2 positions shown; findings below may reference images not displayed]

FINDINGS: Unchanged appearance of round structure at the right cardio phrenic
angle, compared to the prior MRI, likely a pericardial cyst. Both
lungs are clear. The visualized skeletal structures are
unremarkable.
IMPRESSION: No active cardiopulmonary disease.

## 2018-06-22 ENCOUNTER — Encounter: Payer: PRIVATE HEALTH INSURANCE | Admitting: Women's Health

## 2018-07-01 ENCOUNTER — Telehealth: Payer: Self-pay | Admitting: *Deleted

## 2018-07-01 ENCOUNTER — Other Ambulatory Visit: Payer: Self-pay | Admitting: Women's Health

## 2018-07-01 DIAGNOSIS — N390 Urinary tract infection, site not specified: Secondary | ICD-10-CM

## 2018-07-01 DIAGNOSIS — B009 Herpesviral infection, unspecified: Secondary | ICD-10-CM

## 2018-07-01 NOTE — Telephone Encounter (Signed)
[place]/18. She asked if you would be willing to refill Rx's until then for synthroid 50 mcg tablet, valtrex 500 mg tablet, and a Rx for Macrodantin 50 mc capsules (prn after intercourse)   Please advise

## 2018-07-02 MED ORDER — VALACYCLOVIR HCL 500 MG PO TABS: 500.0000 mg | ORAL_TABLET | Freq: Two times a day (BID) | ORAL | 2 refills | Status: AC

## 2018-07-02 MED ORDER — NITROFURANTOIN MACROCRYSTAL 50 MG PO CAPS: ORAL_CAPSULE | ORAL | 0 refills | Status: AC

## 2018-07-02 NOTE — Telephone Encounter (Signed)
Ok for refills, tell her we hope the move is going well.

## 2018-07-02 NOTE — Telephone Encounter (Signed)
Patient aware, Rx's sent.

## 2018-11-05 ENCOUNTER — Other Ambulatory Visit: Payer: Self-pay | Admitting: Women's Health

## 2018-11-28 ENCOUNTER — Other Ambulatory Visit: Payer: Self-pay | Admitting: Women's Health

## 2018-11-28 DIAGNOSIS — M810 Age-related osteoporosis without current pathological fracture: Secondary | ICD-10-CM

## 2019-11-08 ENCOUNTER — Telehealth: Payer: Self-pay

## 2019-11-08 NOTE — Telephone Encounter (Signed)
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 She was asking if when she had her [facility] 40 years ago if she had HPV then. She indicated that her doctor there was interested. I explained that we do not have records that far back in the office and they are kept off site. I was not sure the off site records even went back that far.  I told her I was not sure how something from 40 years ago would be relevant to her care now. If she wanted to try to obtain the result she could print a med records release form from our [facility] website and complete it and send it and someone can get back with her with details.
